# Patient Record
Sex: Female | Born: 1945
Health system: Southern US, Community
[De-identification: ages and names within clinical notes are randomized; demographics above are authoritative.]

## PROBLEM LIST (undated history)

## (undated) DIAGNOSIS — Z972 Presence of dental prosthetic device (complete) (partial): Secondary | ICD-10-CM

## (undated) DIAGNOSIS — E039 Hypothyroidism, unspecified: Secondary | ICD-10-CM

## (undated) DIAGNOSIS — I10 Essential (primary) hypertension: Secondary | ICD-10-CM

## (undated) HISTORY — PX: NASAL SINUS SURGERY: SHX719

## (undated) HISTORY — PX: LASIK: SHX215

## (undated) HISTORY — PX: COLONOSCOPY: SHX174

## (undated) HISTORY — PX: ABDOMINAL HYSTERECTOMY: SHX81

---

## 2004-10-05 ENCOUNTER — Ambulatory Visit: Payer: Self-pay | Admitting: Internal Medicine

## 2006-12-11 ENCOUNTER — Ambulatory Visit: Payer: Self-pay | Admitting: Family Medicine

## 2007-10-01 ENCOUNTER — Ambulatory Visit: Payer: Self-pay

## 2009-02-07 ENCOUNTER — Ambulatory Visit: Payer: Self-pay | Admitting: Family Medicine

## 2012-08-05 ENCOUNTER — Ambulatory Visit: Payer: Self-pay | Admitting: Family Medicine

## 2012-08-11 ENCOUNTER — Ambulatory Visit: Payer: Self-pay | Admitting: Family Medicine

## 2013-04-02 ENCOUNTER — Ambulatory Visit: Payer: Self-pay | Admitting: Family Medicine

## 2013-09-29 ENCOUNTER — Ambulatory Visit: Payer: Self-pay | Admitting: Family Medicine

## 2014-11-04 ENCOUNTER — Other Ambulatory Visit: Payer: Self-pay | Admitting: Family Medicine

## 2014-11-04 DIAGNOSIS — Z1231 Encounter for screening mammogram for malignant neoplasm of breast: Secondary | ICD-10-CM

## 2014-11-17 ENCOUNTER — Ambulatory Visit
Admission: RE | Admit: 2014-11-17 | Discharge: 2014-11-17 | Disposition: A | Discharge status: Home / Self Care | Payer: Medicare Other | Source: Ambulatory Visit | Attending: Family Medicine | Admitting: Family Medicine

## 2014-11-17 ENCOUNTER — Other Ambulatory Visit: Payer: Self-pay | Admitting: Family Medicine

## 2014-11-17 DIAGNOSIS — Z1231 Encounter for screening mammogram for malignant neoplasm of breast: Secondary | ICD-10-CM

## 2016-05-09 ENCOUNTER — Ambulatory Visit (INDEPENDENT_AMBULATORY_CARE_PROVIDER_SITE_OTHER): Payer: Medicare Other

## 2016-05-09 ENCOUNTER — Ambulatory Visit: Payer: Medicare Other

## 2016-05-09 ENCOUNTER — Ambulatory Visit
Admission: EM | Admit: 2016-05-09 | Discharge: 2016-05-09 | Disposition: A | Discharge status: Home / Self Care | Payer: Medicare Other | Attending: Emergency Medicine | Admitting: Emergency Medicine

## 2016-05-09 DIAGNOSIS — S52502A Unspecified fracture of the lower end of left radius, initial encounter for closed fracture: Secondary | ICD-10-CM

## 2016-05-09 HISTORY — DX: Essential (primary) hypertension: I10

## 2016-05-09 HISTORY — DX: Hypothyroidism, unspecified: E03.9

## 2016-05-09 MED ORDER — NAPROXEN 500 MG PO TABS: 500.0000 mg | ORAL_TABLET | Freq: Two times a day (BID) | ORAL | 0 refills | Status: DC

## 2016-05-09 MED ORDER — TRAMADOL HCL 50 MG PO TABS: ORAL_TABLET | ORAL | 0 refills | Status: DC

## 2016-05-09 NOTE — Discharge Instructions (Signed)
Follow-up with the [facility] here in [place] within one week. You may continue your Naprosyn. You can take 1 g of Tylenol up to 4 times a day as needed for pain. This with the Naprosyn is an effective combination for pain. Tramadol for severe pain only.

## 2016-05-09 NOTE — ED Triage Notes (Signed)
Patient complains of a fall one week ago. Patient has significant bruising and deformity of her left wrist. Patient states that pain has been constant. Patient states that she thought she could have waited it out. Patient reports that she fell and hit her arm and she also hit her head and has knot on the left side.

## 2016-05-09 NOTE — ED Provider Notes (Signed)
HPI  SUBJECTIVE:  Brianna Benson is a right handed 70 y.o. female who presents with left wrist pain, swelling, bruising after having a fall from standing on a chair one week ago. States that she lost her balance and fell  onto an outstretched left wrist. She reports intermittent, sharp, seconds long pain with pronation, flexion and extension, radial and ulnar deviation, and thumb flexion. She reports bruising of her volar wrist and dorsal hand, swelling.  Symptoms are better with BC powders and Naprosyn. She has not tried anything else for this. She denies numbness, tingling, limitation motion of her hand or fingers. She denies shoulder, arm, elbow injury. She also states she "bumped her head" but she denies loss of consciousness, headaches or neck pain. She has no previous history of wrist injury. She has a past medical history of hypothyroidism, osteopenia, hypertension, depression. She has no history of anticoagulant/antiplatelet use, kidney disease. PMD: Dr. Tonia GhentAbby Bender.   Past Medical History:  Diagnosis Date  . Hypertension   . Hypothyroidism     Past Surgical History:  Procedure Laterality Date  . NASAL SINUS SURGERY      Family History  Problem Relation Age of Onset  . Breast cancer Maternal Aunt     Social History  Substance Use Topics  . Smoking status: Current Every Day Smoker    Packs/day: 2.00    Types: Cigarettes  . Smokeless tobacco: Never Used  . Alcohol use Yes     Comment: beer daily    No current facility-administered medications for this encounter.   Current Outpatient Prescriptions:  .  levothyroxine (SYNTHROID, LEVOTHROID) 112 MCG tablet, Take 112 mcg by mouth daily before breakfast., Disp: , Rfl:  .  lisinopril (PRINIVIL,ZESTRIL) 10 MG tablet, Take 10 mg by mouth daily., Disp: , Rfl:  .  naproxen (NAPROSYN) 500 MG tablet, Take 1 tablet (500 mg total) by mouth 2 (two) times daily., Disp: 20 tablet, Rfl: 0 .  traMADol (ULTRAM) 50 MG tablet, 1-2 tabs po q 6  hr prn pain Maximum dose= 8 tablets per day, Disp: 20 tablet, Rfl: 0  No Known Allergies   ROS  As noted in HPI.   Physical Exam  BP (!) 141/57 (BP Location: Left Arm)   Pulse 69   Temp 98.3 F (36.8 C) (Tympanic)   Resp 17   Ht 5' 5.5" (1.664 m)   Wt 130 lb (59 kg)   SpO2 96%   BMI 21.30 kg/m   Constitutional: Well developed, well nourished, no acute distress Eyes:  EOMI, conjunctiva normal bilaterally HENT: Normocephalic, atraumatic,mucus membranes moist Respiratory: Normal inspiratory effort Cardiovascular: Normal rate GI: nondistended skin: No rash, skin intact Musculoskeletal: L  distal radius tender, Positive deformity. Bruising on the volar aspects of her wrist. distal ulnar styloid NT, snuffbox NT, carpals NT, metacarpals NT, digits NT , TFCC  tender.  Pain  With passive supination,   no pain with passive pronation, no pain with passive radial / ulnar deviation, passive flexion-extension. Motor intact ability to flex / extend digits of affected hand, Sensation LT to hand normal. CR<2 seconds distally.  Shoulder and upper arm NT, Elbow and proximal forearm NT. Neurologic: Alert & oriented x 3, no focal neuro deficits Psychiatric: Speech and behavior appropriate   ED Course   Medications - No data to display  Orders Placed This Encounter  Procedures  . DG Wrist Complete Left    Standing Status:   Standing    Number of Occurrences:   1  Order Specific Question:   Reason for Exam (SYMPTOM  OR DIAGNOSIS REQUIRED)    Answer:   s/p fall and deformity  . Ambulatory referral to Orthopedic Surgery    Referral Priority:   Urgent    Referral Type:   Surgical    Referral Reason:   Specialty Services Required    Requested Specialty:   Orthopedic Surgery    Number of Visits Requested:   1  . Apply other splint    Standing Status:   Standing    Number of Occurrences:   1    Order Specific Question:   Laterality    Answer:   Left    Order Specific Question:   Splint  type    Answer:   sugartong splint wrist    No results found for this or any previous visit (from the past 24 hour(s)). Dg Wrist Complete Left  Result Date: 05/09/2016 CLINICAL DATA:  Fall 5 days ago on outstretched left hand, pain, initial encounter. EXAM: LEFT WRIST - COMPLETE 3+ VIEW COMPARISON:  None. FINDINGS: There is a transversely oriented and mildly impacted fracture of the distal radial metaphysis. No additional evidence of an acute fracture. IMPRESSION: Transversely oriented and mildly impacted fracture of the distal radial metaphysis. Electronically Signed   By: Leanna BattlesMelinda  Blietz M.D.   On: 05/09/2016 12:05    ED Clinical Impression  Closed fracture of distal end of left radius, unspecified fracture morphology, initial encounter  ED Assessment/Plan   No evidence of significant head injury. She is neurologically intact and his been awake since the incident. Deferring head CT.  Reviewed imaging independently positive transversely oriented and mildly impacted fracture of the distal radial metaphysis. See radiology report for details.  Discussed case with Dr. Joice LoftsPoggi, ortho on call. Advised sugartong splint, Tylenol, tramadol. She may continue her Naprosyn. She is to follow-up in their clinic within the week at [facility] [place]. Discussed imaging, MDM, plan and followup with patient.  Discussed sn/sx that should prompt return to the ED. Patient agrees with plan.   Meds ordered this encounter  Medications  . levothyroxine (SYNTHROID, LEVOTHROID) 112 MCG tablet    Sig: Take 112 mcg by mouth daily before breakfast.  . lisinopril (PRINIVIL,ZESTRIL) 10 MG tablet    Sig: Take 10 mg by mouth daily.  . traMADol (ULTRAM) 50 MG tablet    Sig: 1-2 tabs po q 6 hr prn pain Maximum dose= 8 tablets per day    Dispense:  20 tablet    Refill:  0  . naproxen (NAPROSYN) 500 MG tablet    Sig: Take 1 tablet (500 mg total) by mouth 2 (two) times daily.    Dispense:  20 tablet    Refill:  0     *This clinic note was created using Scientist, clinical (histocompatibility and immunogenetics)Dragon dictation software. Therefore, there may be occasional mistakes despite careful proofreading.  ?   Domenick GongAshley Honestie Kulik, MD 05/09/16 1242

## 2016-08-28 ENCOUNTER — Encounter: Payer: Self-pay | Admitting: *Deleted

## 2016-08-31 NOTE — Discharge Instructions (Signed)
Cataract Surgery, Care After °Refer to this sheet in the next few weeks. These instructions provide you with information about caring for yourself after your procedure. Your health care provider may also give you more specific instructions. Your treatment has been planned according to current medical practices, but problems sometimes occur. Call your health care provider if you have any problems or questions after your procedure. °What can I expect after the procedure? °After the procedure, it is common to have: °· Itching. °· Discomfort. °· Fluid discharge. °· Sensitivity to light and to touch. °· Bruising. °Follow these instructions at home: °Eye Care  °· Check your eye every day for signs of infection. Watch for: °¨ Redness, swelling, or pain. °¨ Fluid, blood, or pus. °¨ Warmth. °¨ Bad smell. °Activity  °· Avoid strenuous activities, such as playing contact sports, for as long as told by your health care provider. °· Do not drive or operate heavy machinery until your health care provider approves. °· Do not bend or lift heavy objects . Bending increases pressure in the eye. You can walk, climb stairs, and do light household chores. °· Ask your health care provider when you can return to work. If you work in a dusty environment, you may be advised to wear protective eyewear for a period of time. °General instructions  °· Take or apply over-the-counter and prescription medicines only as told by your health care provider. This includes eye drops. °· Do not touch or rub your eyes. °· If you were given a protective shield, wear it as told by your health care provider. If you were not given a protective shield, wear sunglasses as told by your health care provider to protect your eyes. °· Keep the area around your eye clean and dry. Avoid swimming or allowing water to hit you directly in the face while showering until told by your health care provider. Keep soap and shampoo out of your eyes. °· Do not put a contact lens  into the affected eye or eyes until your health care provider approves. °· Keep all follow-up visits as told by your health care provider. This is important. °Contact a health care provider if: ° °· You have increased bruising around your eye. °· You have pain that is not helped with medicine. °· You have a fever. °· You have redness, swelling, or pain in your eye. °· You have fluid, blood, or pus coming from your incision. °· Your vision gets worse. °Get help right away if: °· You have sudden vision loss. °This information is not intended to replace advice given to you by your health care provider. Make sure you discuss any questions you have with your health care provider. °Document Released: 12/29/2004 Document Revised: 10/20/2015 Document Reviewed: 04/21/2015 °Elsevier Interactive Patient Education © 2017 [employer]vier [employer] ° ° ° ° °General Anesthesia, Adult, Care After °These instructions provide you with information about caring for yourself after your procedure. Your health care provider may also give you more specific instructions. Your treatment has been planned according to current medical practices, but problems sometimes occur. Call your health care provider if you have any problems or questions after your procedure. °What can I expect after the procedure? °After the procedure, it is common to have: °· Vomiting. °· A sore throat. °· Mental slowness. °It is common to feel: °· Nauseous. °· Cold or shivery. °· Sleepy. °· Tired. °· Sore or achy, even in parts of your body where you did not have surgery. °Follow these instructions at   home: °For at least 24 hours after the procedure:  °· Do not: °¨ Participate in activities where you could fall or become injured. °¨ Drive. °¨ Use heavy machinery. °¨ Drink alcohol. °¨ Take sleeping pills or medicines that cause drowsiness. °¨ Make important decisions or sign legal documents. °¨ Take care of children on your own. °· Rest. °Eating and drinking  °· If you vomit, drink  water, juice, or soup when you can drink without vomiting. °· Drink enough fluid to keep your urine clear or pale yellow. °· Make sure you have little or no nausea before eating solid foods. °· Follow the diet recommended by your health care provider. °General instructions  °· Have a responsible adult stay with you until you are awake and alert. °· Return to your normal activities as told by your health care provider. Ask your health care provider what activities are safe for you. °· Take over-the-counter and prescription medicines only as told by your health care provider. °· If you smoke, do not smoke without supervision. °· Keep all follow-up visits as told by your health care provider. This is important. °Contact a health care provider if: °· You continue to have nausea or vomiting at home, and medicines are not helpful. °· You cannot drink fluids or start eating again. °· You cannot urinate after 8-12 hours. °· You develop a skin rash. °· You have fever. °· You have increasing redness at the site of your procedure. °Get help right away if: °· You have difficulty breathing. °· You have chest pain. °· You have unexpected bleeding. °· You feel that you are having a life-threatening or urgent problem. °This information is not intended to replace advice given to you by your health care provider. Make sure you discuss any questions you have with your health care provider. °Document Released: 09/17/2000 Document Revised: 11/14/2015 Document Reviewed: 05/26/2015 °Elsevier Interactive Patient Education © 2017 Elsevier Inc. ° °

## 2016-09-03 ENCOUNTER — Encounter
Admission: RE | Disposition: A | Discharge status: Home / Self Care | Payer: Self-pay | Source: Ambulatory Visit | Attending: Ophthalmology

## 2016-09-03 ENCOUNTER — Ambulatory Visit: Payer: Medicare Other | Admitting: Anesthesiology

## 2016-09-03 ENCOUNTER — Ambulatory Visit
Admission: RE | Admit: 2016-09-03 | Discharge: 2016-09-03 | Disposition: A | Discharge status: Home / Self Care | Payer: Medicare Other | Source: Ambulatory Visit | Attending: Ophthalmology | Admitting: Ophthalmology

## 2016-09-03 DIAGNOSIS — F17200 Nicotine dependence, unspecified, uncomplicated: Secondary | ICD-10-CM | POA: Insufficient documentation

## 2016-09-03 DIAGNOSIS — I10 Essential (primary) hypertension: Secondary | ICD-10-CM | POA: Diagnosis not present

## 2016-09-03 DIAGNOSIS — E039 Hypothyroidism, unspecified: Secondary | ICD-10-CM | POA: Insufficient documentation

## 2016-09-03 DIAGNOSIS — H2512 Age-related nuclear cataract, left eye: Secondary | ICD-10-CM | POA: Insufficient documentation

## 2016-09-03 HISTORY — DX: Presence of dental prosthetic device (complete) (partial): Z97.2

## 2016-09-03 HISTORY — PX: CATARACT EXTRACTION W/PHACO: SHX586

## 2016-09-03 SURGERY — PHACOEMULSIFICATION, CATARACT, WITH IOL INSERTION
Anesthesia: Monitor Anesthesia Care | Site: Eye | Laterality: Left | Wound class: Clean

## 2016-09-03 MED ORDER — EPINEPHRINE PF 1 MG/ML IJ SOLN
INTRAMUSCULAR | Status: DC | PRN
  Administered 2016-09-03: 121 mL via OPHTHALMIC

## 2016-09-03 MED ORDER — FENTANYL CITRATE (PF) 100 MCG/2ML IJ SOLN
INTRAMUSCULAR | Status: DC | PRN
  Administered 2016-09-03: 50 ug via INTRAVENOUS

## 2016-09-03 MED ORDER — MOXIFLOXACIN HCL 0.5 % OP SOLN
1.0000 [drp] | OPHTHALMIC | Status: DC | PRN
  Administered 2016-09-03 (×3): 1 [drp] via OPHTHALMIC

## 2016-09-03 MED ORDER — ACETAMINOPHEN 160 MG/5ML PO SOLN: 325.0000 mg | ORAL | Status: DC | PRN

## 2016-09-03 MED ORDER — NA HYALUR & NA CHOND-NA HYALUR 0.4-0.35 ML IO KIT
PACK | INTRAOCULAR | Status: DC | PRN
  Administered 2016-09-03: 1 mL via INTRAOCULAR

## 2016-09-03 MED ORDER — BRIMONIDINE TARTRATE-TIMOLOL 0.2-0.5 % OP SOLN
OPHTHALMIC | Status: DC | PRN
  Administered 2016-09-03: 1 [drp] via OPHTHALMIC

## 2016-09-03 MED ORDER — CEFUROXIME OPHTHALMIC INJECTION 1 MG/0.1 ML
INJECTION | OPHTHALMIC | Status: DC | PRN
Start: 2016-09-03 — End: 2016-09-03
  Administered 2016-09-03: .2 mL via SUBCONJUNCTIVAL

## 2016-09-03 MED ORDER — ACETAMINOPHEN 325 MG PO TABS: 325.0000 mg | ORAL_TABLET | ORAL | Status: DC | PRN

## 2016-09-03 MED ORDER — MIDAZOLAM HCL 2 MG/2ML IJ SOLN
INTRAMUSCULAR | Status: DC | PRN
  Administered 2016-09-03: 2 mg via INTRAVENOUS

## 2016-09-03 MED ORDER — ARMC OPHTHALMIC DILATING DROPS
1.0000 "application " | OPHTHALMIC | Status: DC | PRN
  Administered 2016-09-03 (×3): 1 via OPHTHALMIC

## 2016-09-03 MED ORDER — LIDOCAINE HCL (PF) 2 % IJ SOLN
INTRAMUSCULAR | Status: DC | PRN
  Administered 2016-09-03: 1 mL

## 2016-09-03 SURGICAL SUPPLY — 23 items
APPLICATOR COTTON TIP 3IN (MISCELLANEOUS) ×3 IMPLANT
CANNULA ANT/CHMB 27GA (MISCELLANEOUS) ×3 IMPLANT
DISSECTOR HYDRO NUCLEUS 50X22 (MISCELLANEOUS) ×3 IMPLANT
GLOVE BIO SURGEON STRL SZ7 (GLOVE) ×3 IMPLANT
GLOVE SURG LX 6.5 MICRO (GLOVE) ×2
GLOVE SURG LX STRL 6.5 MICRO (GLOVE) ×1 IMPLANT
GOWN STRL REUS W/ TWL LRG LVL3 (GOWN DISPOSABLE) ×2 IMPLANT
GOWN STRL REUS W/TWL LRG LVL3 (GOWN DISPOSABLE) ×4
LENS IOL ACRSF IQ ULTRA 26.5 (Intraocular Lens) ×1 IMPLANT
LENS IOL ACRYSOF IQ 26.5 (Intraocular Lens) ×3 IMPLANT
MARKER SKIN DUAL TIP RULER LAB (MISCELLANEOUS) ×3 IMPLANT
PACK CATARACT BRASINGTON (MISCELLANEOUS) ×3 IMPLANT
PACK EYE AFTER SURG (MISCELLANEOUS) ×3 IMPLANT
PACK OPTHALMIC (MISCELLANEOUS) ×3 IMPLANT
RING MALYGIN 7.0 (MISCELLANEOUS) IMPLANT
SUT ETHILON 10-0 CS-B-6CS-B-6 (SUTURE)
SUT VICRYL  9 0 (SUTURE)
SUT VICRYL 9 0 (SUTURE) IMPLANT
SUTURE EHLN 10-0 CS-B-6CS-B-6 (SUTURE) IMPLANT
SYR TB 1ML LUER SLIP (SYRINGE) ×3 IMPLANT
WATER STERILE IRR 250ML POUR (IV SOLUTION) ×3 IMPLANT
WICK EYE OCUCEL (MISCELLANEOUS) IMPLANT
WIPE NON LINTING 3.25X3.25 (MISCELLANEOUS) ×3 IMPLANT

## 2016-09-03 NOTE — Anesthesia Procedure Notes (Signed)
Performed by: Karlo Goeden Pre-anesthesia Checklist: Patient identified, Emergency Drugs available, Suction available, Timeout performed and Patient being monitored Patient Re-evaluated:Patient Re-evaluated prior to inductionOxygen Delivery Method: Circle system utilized Preoxygenation: Pre-oxygenation with 100% oxygen Intubation Type: Inhalational induction Ventilation: Mask ventilation without difficulty and Mask ventilation throughout procedure Dental Injury: Teeth and Oropharynx as per pre-operative assessment        

## 2016-09-03 NOTE — H&P (Signed)
H+P reviewed and is up to date, please see paper chart.  

## 2016-09-03 NOTE — Op Note (Signed)
Date of Surgery: 09/03/2016  PREOPERATIVE DIAGNOSES: Visually significant nuclear sclerotic cataract, left eye.  POSTOPERATIVE DIAGNOSES: Same  PROCEDURES PERFORMED: Cataract extraction with intraocular lens implant, left eye.  SURGEON: Almon Hercules, M.D.  ANESTHESIA: MAC and topical  IMPLANTS: [id number] +26.5 D  Implant Name Type Inv. Item Serial No. Manufacturer Lot No. LRB No. Used  LENS IOL ACRYSOF IQ 26.5 - [id number] Intraocular Lens LENS IOL ACRYSOF IQ 26.5 [id number] ALCON   Left 1    COMPLICATIONS: None.  DESCRIPTION OF PROCEDURE: Therapeutic options were discussed with the patient preoperatively, including a discussion of risks and benefits of surgery. Informed consent was obtained. An IOL-Master and immersion biometry were used to take the lens measurements, and a dilated fundus exam was performed within 6 months of the surgical date.  The patient was premedicated and brought to the operating room and placed on the operating table in the supine position. After adequate anesthesia, the patient was prepped and draped in the usual sterile ophthalmic fashion. A wire lid speculum was inserted and the microscope was positioned. A Superblade was used to create a paracentesis site at the limbus and a small amount of dilute preservative free lidocaine was instilled into the anterior chamber, followed by dispersive viscoelastic. A clear corneal incision was created temporally using a 2.4 mm keratome blade. Capsulorrhexis was then performed. In situ phacoemulsification was performed.  Cortical material was removed with the irrigation-aspiration unit. Dispersive viscoelastic was instilled to open the capsular bag. A posterior chamber intraocular lens with the specifications above was inserted and positioned. Irrigation-aspiration was used to remove all viscoelastic. Cefuroxime 1cc was instilled into the anterior chamber, and the corneal incision was checked and found to be water tight. The  eyelid speculum was removed.  The operative eye was covered with protective goggles after instilling 1 drop of timolol and brimonidine. The patient tolerated the procedure well. There were no complications.

## 2016-09-03 NOTE — Anesthesia Preprocedure Evaluation (Signed)
Anesthesia Evaluation  Patient identified by MRN, date of birth, ID band Patient awake    Reviewed: Allergy & Precautions, H&P , NPO status , Patient's Chart, lab work & pertinent test results, reviewed documented beta blocker date and time   Airway Mallampati: II  TM Distance: >3 FB Neck ROM: full    Dental no notable dental hx.    Pulmonary Current Smoker,    Pulmonary exam normal breath sounds clear to auscultation       Cardiovascular Exercise Tolerance: Good hypertension,  Rhythm:regular Rate:Normal     Neuro/Psych negative neurological ROS  negative psych ROS   GI/Hepatic negative GI ROS, Neg liver ROS,   Endo/Other  Hypothyroidism   Renal/GU negative Renal ROS  negative genitourinary   Musculoskeletal   Abdominal   Peds  Hematology negative hematology ROS (+)   Anesthesia Other Findings   Reproductive/Obstetrics negative OB ROS                             Anesthesia Physical Anesthesia Plan  ASA: II  Anesthesia Plan: MAC   Post-op Pain Management:    Induction:   Airway Management Planned:   Additional Equipment:   Intra-op Plan:   Post-operative Plan:   Informed Consent: I have reviewed the patients History and Physical, chart, labs and discussed the procedure including the risks, benefits and alternatives for the proposed anesthesia with the patient or authorized representative who has indicated his/her understanding and acceptance.   Dental Advisory Given  Plan Discussed with: CRNA  Anesthesia Plan Comments:         Anesthesia Quick Evaluation

## 2016-09-03 NOTE — Transfer of Care (Signed)
Immediate Anesthesia Transfer of Care Note  Patient: Brianna Benson  Procedure(s) Performed: Procedure(s): CATARACT EXTRACTION PHACO AND INTRAOCULAR LENS PLACEMENT (IOC)  Left (Left)  Patient Location: PACU  Anesthesia Type: MAC  Level of Consciousness: awake, alert  and patient cooperative  Airway and Oxygen Therapy: Patient Spontanous Breathing and Patient connected to supplemental oxygen  Post-op Assessment: Post-op Vital signs reviewed, Patient's Cardiovascular Status Stable, Respiratory Function Stable, Patent Airway and No signs of Nausea or vomiting  Post-op Vital Signs: Reviewed and stable  Complications: No apparent anesthesia complications

## 2016-09-03 NOTE — Anesthesia Postprocedure Evaluation (Signed)
Anesthesia Post Note  Patient: Brianna Benson  Procedure(s) Performed: Procedure(s) (LRB): CATARACT EXTRACTION PHACO AND INTRAOCULAR LENS PLACEMENT (IOC)  Left (Left)  Patient location during evaluation: PACU Anesthesia Type: MAC Level of consciousness: awake and alert Pain management: pain level controlled Vital Signs Assessment: post-procedure vital signs reviewed and stable Respiratory status: spontaneous breathing, nonlabored ventilation, respiratory function stable and patient connected to nasal cannula oxygen Cardiovascular status: stable and blood pressure returned to baseline Anesthetic complications: no    Alisa Graff

## 2016-09-04 ENCOUNTER — Encounter: Payer: Self-pay | Admitting: Ophthalmology

## 2017-09-09 DIAGNOSIS — I10 Essential (primary) hypertension: Secondary | ICD-10-CM | POA: Diagnosis not present

## 2017-09-09 DIAGNOSIS — E559 Vitamin D deficiency, unspecified: Secondary | ICD-10-CM | POA: Diagnosis not present

## 2017-09-09 DIAGNOSIS — E039 Hypothyroidism, unspecified: Secondary | ICD-10-CM | POA: Diagnosis not present

## 2018-04-14 ENCOUNTER — Ambulatory Visit
Admission: RE | Admit: 2018-04-14 | Discharge: 2018-04-14 | Disposition: A | Discharge status: Home / Self Care | Payer: Medicare HMO | Source: Ambulatory Visit | Attending: Family Medicine | Admitting: Family Medicine

## 2018-04-14 ENCOUNTER — Other Ambulatory Visit: Payer: Self-pay | Admitting: Family Medicine

## 2018-04-14 DIAGNOSIS — S2241XA Multiple fractures of ribs, right side, initial encounter for closed fracture: Secondary | ICD-10-CM | POA: Insufficient documentation

## 2018-04-14 DIAGNOSIS — R0781 Pleurodynia: Secondary | ICD-10-CM | POA: Diagnosis not present

## 2018-04-14 DIAGNOSIS — E785 Hyperlipidemia, unspecified: Secondary | ICD-10-CM | POA: Diagnosis not present

## 2018-04-14 DIAGNOSIS — E039 Hypothyroidism, unspecified: Secondary | ICD-10-CM | POA: Diagnosis not present

## 2018-04-14 DIAGNOSIS — Z1159 Encounter for screening for other viral diseases: Secondary | ICD-10-CM | POA: Diagnosis not present

## 2018-04-14 DIAGNOSIS — E559 Vitamin D deficiency, unspecified: Secondary | ICD-10-CM | POA: Diagnosis not present

## 2018-04-25 DIAGNOSIS — H2511 Age-related nuclear cataract, right eye: Secondary | ICD-10-CM | POA: Diagnosis not present

## 2018-05-08 DIAGNOSIS — E876 Hypokalemia: Secondary | ICD-10-CM | POA: Diagnosis not present

## 2018-05-08 DIAGNOSIS — R0781 Pleurodynia: Secondary | ICD-10-CM | POA: Diagnosis not present

## 2018-05-13 ENCOUNTER — Telehealth: Payer: Self-pay | Admitting: *Deleted

## 2018-05-13 DIAGNOSIS — Z122 Encounter for screening for malignant neoplasm of respiratory organs: Secondary | ICD-10-CM

## 2018-05-13 DIAGNOSIS — Z87891 Personal history of nicotine dependence: Secondary | ICD-10-CM

## 2018-05-13 NOTE — Telephone Encounter (Signed)
Received referral for initial lung cancer screening scan. Contacted patient and obtained smoking history,(current, 52 pack year) as well as answering questions related to screening process. Patient denies signs of lung cancer such as weight loss or hemoptysis. Patient denies comorbidity that would prevent curative treatment if lung cancer were found. Patient is scheduled for shared decision making visit and CT scan on 06/03/18 at 130.

## 2018-05-14 ENCOUNTER — Other Ambulatory Visit: Payer: Self-pay | Admitting: Family Medicine

## 2018-05-28 DIAGNOSIS — Z1389 Encounter for screening for other disorder: Secondary | ICD-10-CM | POA: Diagnosis not present

## 2018-05-28 DIAGNOSIS — Z0000 Encounter for general adult medical examination without abnormal findings: Secondary | ICD-10-CM | POA: Diagnosis not present

## 2018-05-28 DIAGNOSIS — E785 Hyperlipidemia, unspecified: Secondary | ICD-10-CM | POA: Diagnosis not present

## 2018-05-28 DIAGNOSIS — Z23 Encounter for immunization: Secondary | ICD-10-CM | POA: Diagnosis not present

## 2018-05-28 DIAGNOSIS — I10 Essential (primary) hypertension: Secondary | ICD-10-CM | POA: Diagnosis not present

## 2018-05-29 ENCOUNTER — Other Ambulatory Visit: Payer: Self-pay | Admitting: Family Medicine

## 2018-05-29 DIAGNOSIS — M8580 Other specified disorders of bone density and structure, unspecified site: Secondary | ICD-10-CM

## 2018-05-29 DIAGNOSIS — Z1231 Encounter for screening mammogram for malignant neoplasm of breast: Secondary | ICD-10-CM

## 2018-06-03 ENCOUNTER — Ambulatory Visit: Payer: Medicare HMO

## 2018-06-03 ENCOUNTER — Ambulatory Visit: Payer: Medicare HMO | Admitting: Oncology

## 2018-06-03 ENCOUNTER — Inpatient Hospital Stay: Payer: Medicare HMO | Admitting: Oncology

## 2018-06-03 ENCOUNTER — Encounter: Payer: Self-pay | Admitting: Oncology

## 2018-06-03 ENCOUNTER — Ambulatory Visit: Payer: Medicare HMO | Attending: Oncology

## 2018-06-24 ENCOUNTER — Encounter: Payer: Self-pay | Admitting: *Deleted

## 2018-06-30 ENCOUNTER — Telehealth: Payer: Self-pay | Admitting: *Deleted

## 2018-06-30 NOTE — Telephone Encounter (Signed)
Received a referral for initial lung cancer screening scan.  Contacted the patient and obtained their smoking history, current smoker 1 ppd for 52 years with 852 pkyr history  as well as answering questions related to screening process.  Patient denies signs of lung cancer such as weight loss or hemoptysis at this time.  Patient denies comorbidity that would prevent curative treatment if lung cancer were found.  Patient is scheduled for the Shared Decision Making Visit and CT scan on 07-19-2018.

## 2018-07-03 DIAGNOSIS — E559 Vitamin D deficiency, unspecified: Secondary | ICD-10-CM | POA: Diagnosis not present

## 2018-07-03 DIAGNOSIS — E039 Hypothyroidism, unspecified: Secondary | ICD-10-CM | POA: Diagnosis not present

## 2018-07-03 DIAGNOSIS — F17209 Nicotine dependence, unspecified, with unspecified nicotine-induced disorders: Secondary | ICD-10-CM | POA: Diagnosis not present

## 2018-07-03 DIAGNOSIS — I10 Essential (primary) hypertension: Secondary | ICD-10-CM | POA: Diagnosis not present

## 2018-07-16 DIAGNOSIS — H2511 Age-related nuclear cataract, right eye: Secondary | ICD-10-CM | POA: Diagnosis not present

## 2018-07-17 ENCOUNTER — Other Ambulatory Visit: Payer: Medicare HMO

## 2018-07-21 ENCOUNTER — Telehealth: Payer: Self-pay | Admitting: *Deleted

## 2018-07-21 NOTE — Telephone Encounter (Signed)
Called pt to remind her of her appt for ldct screening on 07-22-2018 @0830 , voiced understanding.

## 2018-07-22 ENCOUNTER — Inpatient Hospital Stay: Payer: Medicare HMO | Attending: Oncology | Admitting: Nurse Practitioner

## 2018-07-22 ENCOUNTER — Ambulatory Visit
Admission: RE | Admit: 2018-07-22 | Discharge: 2018-07-22 | Disposition: A | Discharge status: Home / Self Care | Payer: Medicare HMO | Source: Ambulatory Visit | Attending: Oncology | Admitting: Oncology

## 2018-07-22 DIAGNOSIS — Z87891 Personal history of nicotine dependence: Secondary | ICD-10-CM | POA: Insufficient documentation

## 2018-07-22 DIAGNOSIS — F17210 Nicotine dependence, cigarettes, uncomplicated: Secondary | ICD-10-CM

## 2018-07-22 DIAGNOSIS — Z720 Tobacco use: Secondary | ICD-10-CM

## 2018-07-22 DIAGNOSIS — Z122 Encounter for screening for malignant neoplasm of respiratory organs: Secondary | ICD-10-CM

## 2018-07-22 NOTE — Progress Notes (Signed)
In accordance with CMS guidelines, patient has met eligibility criteria including age, absence of signs or symptoms of lung cancer.  Social History   Tobacco Use  . Smoking status: Current Every Day Smoker    Packs/day: 1.00    Years: 52.00    Pack years: 52.00    Types: Cigarettes  . Smokeless tobacco: Never Used  Substance Use Topics  . Alcohol use: Yes    Comment: beer daily  . Drug use: No      A shared decision-making session was conducted prior to the performance of CT scan. This includes one or more decision aids, includes benefits and harms of screening, follow-up diagnostic testing, over-diagnosis, false positive rate, and total radiation exposure.   Counseling on the importance of adherence to annual lung cancer LDCT screening, impact of co-morbidities, and ability or willingness to undergo diagnosis and treatment is imperative for compliance of the program.   Counseling on the importance of continued smoking cessation for former smokers; the importance of smoking cessation for current smokers, and information about tobacco cessation interventions have been given to patient including [place] and 1800 quit [place] programs.   Written order for lung cancer screening with LDCT has been given to the patient and any and all questions have been answered to the best of my abilities.    Yearly follow up will be coordinated by Burgess Estelle, Thoracic Navigator.  Beckey Rutter, DNP, AGNP-C [place] [facility] [phone] (work cell) [phone] (office) 07/22/18 10:06 AM

## 2018-07-23 ENCOUNTER — Encounter: Payer: Self-pay | Admitting: *Deleted

## 2018-07-29 ENCOUNTER — Other Ambulatory Visit: Payer: Self-pay

## 2018-07-29 ENCOUNTER — Encounter: Payer: Self-pay | Admitting: *Deleted

## 2018-07-30 ENCOUNTER — Ambulatory Visit
Admission: RE | Admit: 2018-07-30 | Discharge: 2018-07-30 | Disposition: A | Discharge status: Home / Self Care | Payer: Medicare HMO | Source: Ambulatory Visit | Attending: Family Medicine | Admitting: Family Medicine

## 2018-07-30 DIAGNOSIS — Z1231 Encounter for screening mammogram for malignant neoplasm of breast: Secondary | ICD-10-CM

## 2018-07-30 DIAGNOSIS — M8580 Other specified disorders of bone density and structure, unspecified site: Secondary | ICD-10-CM

## 2018-07-30 DIAGNOSIS — M810 Age-related osteoporosis without current pathological fracture: Secondary | ICD-10-CM | POA: Insufficient documentation

## 2018-07-30 DIAGNOSIS — Z1239 Encounter for other screening for malignant neoplasm of breast: Secondary | ICD-10-CM | POA: Diagnosis not present

## 2018-07-30 NOTE — Discharge Instructions (Signed)

## 2018-08-01 NOTE — Anesthesia Preprocedure Evaluation (Addendum)
Anesthesia Evaluation  Patient identified by MRN, date of birth, ID band Patient awake    Reviewed: Allergy & Precautions, NPO status , Patient's Chart, lab work & pertinent test results  History of Anesthesia Complications Negative for: history of anesthetic complications  Airway Mallampati: II   Neck ROM: Full    Dental  (+) Upper Dentures,    Pulmonary Current Smoker (1 ppd),    Pulmonary exam normal breath sounds clear to auscultation       Cardiovascular hypertension, Normal cardiovascular exam Rhythm:Regular Rate:Normal     Neuro/Psych negative neurological ROS     GI/Hepatic negative GI ROS,   Endo/Other  Hypothyroidism   Renal/GU negative Renal ROS     Musculoskeletal   Abdominal   Peds  Hematology negative hematology ROS (+)   Anesthesia Other Findings   Reproductive/Obstetrics                            Anesthesia Physical Anesthesia Plan  ASA: II  Anesthesia Plan: MAC   Post-op Pain Management:    Induction: Intravenous  PONV Risk Score and Plan: 1 and TIVA and Midazolam  Airway Management Planned: Natural Airway  Additional Equipment:   Intra-op Plan:   Post-operative Plan:   Informed Consent: I have reviewed the patients History and Physical, chart, labs and discussed the procedure including the risks, benefits and alternatives for the proposed anesthesia with the patient or authorized representative who has indicated his/her understanding and acceptance.       Plan Discussed with: CRNA  Anesthesia Plan Comments:        Anesthesia Quick Evaluation

## 2018-08-04 ENCOUNTER — Encounter
Admission: RE | Disposition: A | Discharge status: Home / Self Care | Payer: Self-pay | Source: Home / Self Care | Attending: Ophthalmology

## 2018-08-04 ENCOUNTER — Ambulatory Visit
Admission: RE | Admit: 2018-08-04 | Discharge: 2018-08-04 | Disposition: A | Discharge status: Home / Self Care | Payer: Medicare HMO | Attending: Ophthalmology | Admitting: Ophthalmology

## 2018-08-04 ENCOUNTER — Ambulatory Visit: Payer: Medicare HMO | Admitting: Anesthesiology

## 2018-08-04 DIAGNOSIS — I10 Essential (primary) hypertension: Secondary | ICD-10-CM | POA: Diagnosis not present

## 2018-08-04 DIAGNOSIS — F17200 Nicotine dependence, unspecified, uncomplicated: Secondary | ICD-10-CM | POA: Insufficient documentation

## 2018-08-04 DIAGNOSIS — E039 Hypothyroidism, unspecified: Secondary | ICD-10-CM | POA: Diagnosis not present

## 2018-08-04 DIAGNOSIS — H25811 Combined forms of age-related cataract, right eye: Secondary | ICD-10-CM | POA: Diagnosis not present

## 2018-08-04 DIAGNOSIS — Z79890 Hormone replacement therapy (postmenopausal): Secondary | ICD-10-CM | POA: Insufficient documentation

## 2018-08-04 DIAGNOSIS — Z79899 Other long term (current) drug therapy: Secondary | ICD-10-CM | POA: Insufficient documentation

## 2018-08-04 DIAGNOSIS — H2511 Age-related nuclear cataract, right eye: Secondary | ICD-10-CM | POA: Insufficient documentation

## 2018-08-04 DIAGNOSIS — Z7983 Long term (current) use of bisphosphonates: Secondary | ICD-10-CM | POA: Diagnosis not present

## 2018-08-04 HISTORY — PX: CATARACT EXTRACTION W/PHACO: SHX586

## 2018-08-04 SURGERY — PHACOEMULSIFICATION, CATARACT, WITH IOL INSERTION
Anesthesia: Monitor Anesthesia Care | Site: Eye | Laterality: Right

## 2018-08-04 MED ORDER — SODIUM HYALURONATE 23 MG/ML IO SOLN
INTRAOCULAR | Status: DC | PRN
  Administered 2018-08-04: 0.6 mL via INTRAOCULAR

## 2018-08-04 MED ORDER — MOXIFLOXACIN HCL 0.5 % OP SOLN
OPHTHALMIC | Status: DC | PRN
  Administered 2018-08-04: 0.2 mL via OPHTHALMIC

## 2018-08-04 MED ORDER — ARMC OPHTHALMIC DILATING DROPS
1.0000 "application " | OPHTHALMIC | Status: DC | PRN
  Administered 2018-08-04 (×3): 1 via OPHTHALMIC

## 2018-08-04 MED ORDER — LACTATED RINGERS IV SOLN: 10.0000 mL/h | INTRAVENOUS | Status: DC

## 2018-08-04 MED ORDER — ONDANSETRON HCL 4 MG/2ML IJ SOLN: 4.0000 mg | Freq: Once | INTRAMUSCULAR | Status: DC | PRN

## 2018-08-04 MED ORDER — MIDAZOLAM HCL 2 MG/2ML IJ SOLN
INTRAMUSCULAR | Status: DC | PRN
  Administered 2018-08-04: 2 mg via INTRAVENOUS

## 2018-08-04 MED ORDER — TETRACAINE HCL 0.5 % OP SOLN
1.0000 [drp] | OPHTHALMIC | Status: DC | PRN
Start: 2018-08-04 — End: 2018-08-04
  Administered 2018-08-04 (×3): 1 [drp] via OPHTHALMIC

## 2018-08-04 MED ORDER — FENTANYL CITRATE (PF) 100 MCG/2ML IJ SOLN
INTRAMUSCULAR | Status: DC | PRN
  Administered 2018-08-04: 50 ug via INTRAVENOUS

## 2018-08-04 MED ORDER — EPINEPHRINE PF 1 MG/ML IJ SOLN
INTRAOCULAR | Status: DC | PRN
  Administered 2018-08-04: 84 mL via OPHTHALMIC

## 2018-08-04 MED ORDER — SODIUM HYALURONATE 10 MG/ML IO SOLN
INTRAOCULAR | Status: DC | PRN
  Administered 2018-08-04: 0.55 mL via INTRAOCULAR

## 2018-08-04 MED ORDER — LIDOCAINE HCL (PF) 2 % IJ SOLN
INTRAOCULAR | Status: DC | PRN
  Administered 2018-08-04: 2 mL via INTRAOCULAR

## 2018-08-04 SURGICAL SUPPLY — 19 items
CANNULA ANT/CHMB 27G (MISCELLANEOUS) ×2 IMPLANT
CANNULA ANT/CHMB 27GA (MISCELLANEOUS) ×6 IMPLANT
DISSECTOR HYDRO NUCLEUS 50X22 (MISCELLANEOUS) ×3 IMPLANT
GLOVE SURG LX 7.5 STRW (GLOVE) ×2
GLOVE SURG LX STRL 7.5 STRW (GLOVE) ×1 IMPLANT
GLOVE SURG SYN 8.5  E (GLOVE) ×2
GLOVE SURG SYN 8.5 E (GLOVE) ×1 IMPLANT
GLOVE SURG SYN 8.5 PF PI (GLOVE) ×1 IMPLANT
GOWN STRL REUS W/ TWL LRG LVL3 (GOWN DISPOSABLE) ×2 IMPLANT
GOWN STRL REUS W/TWL LRG LVL3 (GOWN DISPOSABLE) ×4
LENS IOL ACRYSOF IQ 21.0 (Intraocular Lens) ×2 IMPLANT
MARKER SKIN DUAL TIP RULER LAB (MISCELLANEOUS) ×3 IMPLANT
PACK DR. KING ARMS (PACKS) ×3 IMPLANT
PACK EYE AFTER SURG (MISCELLANEOUS) ×3 IMPLANT
PACK OPTHALMIC (MISCELLANEOUS) ×3 IMPLANT
SYR 3ML LL SCALE MARK (SYRINGE) ×3 IMPLANT
SYR TB 1ML LUER SLIP (SYRINGE) ×3 IMPLANT
WATER STERILE IRR 500ML POUR (IV SOLUTION) ×3 IMPLANT
WIPE NON LINTING 3.25X3.25 (MISCELLANEOUS) ×3 IMPLANT

## 2018-08-04 NOTE — Anesthesia Procedure Notes (Signed)
Procedure Name: MAC Date/Time: 08/04/2018 11:01 AM Performed by: Janna Arch, CRNA Pre-anesthesia Checklist: Patient identified, Emergency Drugs available, Suction available, Timeout performed and Patient being monitored Patient Re-evaluated:Patient Re-evaluated prior to induction Oxygen Delivery Method: Nasal cannula Placement Confirmation: positive ETCO2

## 2018-08-04 NOTE — H&P (Signed)

## 2018-08-04 NOTE — Op Note (Signed)
OPERATIVE NOTE  Brianna Benson [id number] 08/04/2018   PREOPERATIVE DIAGNOSIS:  Nuclear sclerotic cataract right eye.  H25.11   POSTOPERATIVE DIAGNOSIS:    Nuclear sclerotic cataract right eye.     PROCEDURE:  Phacoemusification with posterior chamber intraocular lens placement of the right eye   LENS:   Implant Name Type Inv. Item Serial No. Manufacturer Lot No. LRB No. Used  LENS IOL ACRYSOF IQ 21.0 - [id number] Intraocular Lens LENS IOL ACRYSOF IQ 21.0 [id number] ALCON  Right 1       AU00T0 +21.0   ULTRASOUND TIME: 1 minutes 04 seconds.  CDE 8.16   SURGEON:  Willey BladeBradley King, MD, MPH  ANESTHESIOLOGIST: Anesthesiologist: Reed BreechMazzoni, Andrea, MD CRNA: Maryan RuedWilson, Jennifer M, CRNA   ANESTHESIA:  Topical with tetracaine drops augmented with 1% preservative-free intracameral lidocaine.  ESTIMATED BLOOD LOSS: less than 1 mL.   COMPLICATIONS:  None.   DESCRIPTION OF PROCEDURE:  The patient was identified in the holding room and transported to the operating room and placed in the supine position under the operating microscope.  The right eye was identified as the operative eye and it was prepped and draped in the usual sterile ophthalmic fashion.   A 1.0 millimeter clear-corneal paracentesis was made at the 10:30 position. 0.5 ml of preservative-free 1% lidocaine with epinephrine was injected into the anterior chamber.  The anterior chamber was filled with Healon 5 viscoelastic.  A 2.4 millimeter keratome was used to make a near-clear corneal incision at the 8:00 position.  A curvilinear capsulorrhexis was made with a cystotome and capsulorrhexis forceps.  Balanced salt solution was used to hydrodissect and hydrodelineate the nucleus.   Phacoemulsification was then used in stop and chop fashion to remove the lens nucleus and epinucleus.  The remaining cortex was then removed using the irrigation and aspiration handpiece. Healon was then placed into the capsular bag to distend it for lens  placement.  A lens was then injected into the capsular bag.  The remaining viscoelastic was aspirated.   Wounds were hydrated with balanced salt solution.  The anterior chamber was inflated to a physiologic pressure with balanced salt solution.   Intracameral vigamox 0.1 mL undiluted was injected into the eye and a drop placed onto the ocular surface.  No wound leaks were noted.  The patient was taken to the recovery room in stable condition without complications of anesthesia or surgery  Willey BladeBradley King 08/04/2018, 11:22 AM

## 2018-08-04 NOTE — Anesthesia Postprocedure Evaluation (Signed)
Anesthesia Post Note  Patient: Brianna Benson  Procedure(s) Performed: CATARACT EXTRACTION PHACO AND INTRAOCULAR LENS PLACEMENT (IOC)  RIGHT (Right Eye)  Patient location during evaluation: PACU Anesthesia Type: MAC Level of consciousness: awake and alert, oriented and patient cooperative Pain management: pain level controlled Vital Signs Assessment: post-procedure vital signs reviewed and stable Respiratory status: spontaneous breathing, nonlabored ventilation and respiratory function stable Cardiovascular status: blood pressure returned to baseline and stable Postop Assessment: adequate PO intake Anesthetic complications: no    Darrin Nipper

## 2018-08-04 NOTE — Transfer of Care (Signed)
Immediate Anesthesia Transfer of Care Note  Patient: Brianna Benson  Procedure(s) Performed: CATARACT EXTRACTION PHACO AND INTRAOCULAR LENS PLACEMENT (IOC)  RIGHT (Right Eye)  Patient Location: PACU  Anesthesia Type: MAC  Level of Consciousness: awake, alert  and patient cooperative  Airway and Oxygen Therapy: Patient Spontanous Breathing and Patient connected to supplemental oxygen  Post-op Assessment: Post-op Vital signs reviewed, Patient's Cardiovascular Status Stable, Respiratory Function Stable, Patent Airway and No signs of Nausea or vomiting  Post-op Vital Signs: Reviewed and stable  Complications: No apparent anesthesia complications

## 2018-08-05 ENCOUNTER — Encounter: Payer: Self-pay | Admitting: Ophthalmology

## 2018-08-18 DIAGNOSIS — R5383 Other fatigue: Secondary | ICD-10-CM | POA: Diagnosis not present

## 2018-10-16 DIAGNOSIS — I10 Essential (primary) hypertension: Secondary | ICD-10-CM | POA: Diagnosis not present

## 2018-10-16 DIAGNOSIS — F17209 Nicotine dependence, unspecified, with unspecified nicotine-induced disorders: Secondary | ICD-10-CM | POA: Diagnosis not present

## 2018-10-29 DIAGNOSIS — I10 Essential (primary) hypertension: Secondary | ICD-10-CM | POA: Diagnosis not present

## 2018-10-29 DIAGNOSIS — E785 Hyperlipidemia, unspecified: Secondary | ICD-10-CM | POA: Diagnosis not present

## 2018-10-29 DIAGNOSIS — F4321 Adjustment disorder with depressed mood: Secondary | ICD-10-CM | POA: Diagnosis not present

## 2018-10-29 DIAGNOSIS — R413 Other amnesia: Secondary | ICD-10-CM | POA: Diagnosis not present

## 2018-11-19 DIAGNOSIS — R413 Other amnesia: Secondary | ICD-10-CM | POA: Diagnosis not present

## 2018-11-19 DIAGNOSIS — I10 Essential (primary) hypertension: Secondary | ICD-10-CM | POA: Diagnosis not present

## 2018-11-19 DIAGNOSIS — E039 Hypothyroidism, unspecified: Secondary | ICD-10-CM | POA: Diagnosis not present

## 2018-11-19 DIAGNOSIS — K921 Melena: Secondary | ICD-10-CM | POA: Diagnosis not present

## 2018-12-15 ENCOUNTER — Ambulatory Visit: Payer: Medicare HMO | Admitting: Gastroenterology

## 2018-12-16 ENCOUNTER — Ambulatory Visit: Payer: Medicare HMO | Admitting: Gastroenterology

## 2018-12-22 DIAGNOSIS — K921 Melena: Secondary | ICD-10-CM | POA: Diagnosis not present

## 2018-12-22 DIAGNOSIS — I10 Essential (primary) hypertension: Secondary | ICD-10-CM | POA: Diagnosis not present

## 2018-12-22 DIAGNOSIS — E785 Hyperlipidemia, unspecified: Secondary | ICD-10-CM | POA: Diagnosis not present

## 2019-01-20 DIAGNOSIS — E785 Hyperlipidemia, unspecified: Secondary | ICD-10-CM | POA: Diagnosis not present

## 2019-01-20 DIAGNOSIS — I10 Essential (primary) hypertension: Secondary | ICD-10-CM | POA: Diagnosis not present

## 2019-02-10 DIAGNOSIS — F17209 Nicotine dependence, unspecified, with unspecified nicotine-induced disorders: Secondary | ICD-10-CM | POA: Diagnosis not present

## 2019-02-10 DIAGNOSIS — I10 Essential (primary) hypertension: Secondary | ICD-10-CM | POA: Diagnosis not present

## 2019-03-09 DIAGNOSIS — Z719 Counseling, unspecified: Secondary | ICD-10-CM | POA: Diagnosis not present

## 2019-03-17 DIAGNOSIS — E039 Hypothyroidism, unspecified: Secondary | ICD-10-CM | POA: Diagnosis not present

## 2019-03-17 DIAGNOSIS — R030 Elevated blood-pressure reading, without diagnosis of hypertension: Secondary | ICD-10-CM | POA: Diagnosis not present

## 2019-06-03 DIAGNOSIS — E785 Hyperlipidemia, unspecified: Secondary | ICD-10-CM | POA: Diagnosis not present

## 2019-06-03 DIAGNOSIS — F17209 Nicotine dependence, unspecified, with unspecified nicotine-induced disorders: Secondary | ICD-10-CM | POA: Diagnosis not present

## 2019-07-06 ENCOUNTER — Other Ambulatory Visit: Payer: Self-pay | Admitting: Family Medicine

## 2019-07-06 DIAGNOSIS — Z1231 Encounter for screening mammogram for malignant neoplasm of breast: Secondary | ICD-10-CM

## 2019-07-20 ENCOUNTER — Telehealth: Payer: Self-pay | Admitting: *Deleted

## 2019-07-20 DIAGNOSIS — Z87891 Personal history of nicotine dependence: Secondary | ICD-10-CM

## 2019-07-20 NOTE — Telephone Encounter (Signed)
Patient has been notified that annual lung cancer screening low dose CT scan is due currently or will be in near future. Confirmed that patient is within the age range of 55-77, and asymptomatic, (no signs or symptoms of lung cancer). Patient denies illness that would prevent curative treatment for lung cancer if found. Verified smoking history, (current, 53 pack year). The shared decision making visit was done 07/22/17. Patient is agreeable for CT scan being scheduled.

## 2019-07-29 ENCOUNTER — Ambulatory Visit: Payer: Medicare HMO

## 2019-08-03 ENCOUNTER — Ambulatory Visit
Admission: RE | Admit: 2019-08-03 | Discharge: 2019-08-03 | Disposition: A | Discharge status: Home / Self Care | Payer: Medicare HMO | Source: Ambulatory Visit | Attending: Family Medicine | Admitting: Family Medicine

## 2019-08-03 ENCOUNTER — Other Ambulatory Visit: Payer: Self-pay

## 2019-08-03 DIAGNOSIS — Z1231 Encounter for screening mammogram for malignant neoplasm of breast: Secondary | ICD-10-CM | POA: Insufficient documentation

## 2019-08-04 DIAGNOSIS — I10 Essential (primary) hypertension: Secondary | ICD-10-CM | POA: Diagnosis not present

## 2019-08-04 DIAGNOSIS — E785 Hyperlipidemia, unspecified: Secondary | ICD-10-CM | POA: Diagnosis not present

## 2019-08-04 DIAGNOSIS — E039 Hypothyroidism, unspecified: Secondary | ICD-10-CM | POA: Diagnosis not present

## 2019-08-04 DIAGNOSIS — F4321 Adjustment disorder with depressed mood: Secondary | ICD-10-CM | POA: Diagnosis not present

## 2019-08-04 DIAGNOSIS — E559 Vitamin D deficiency, unspecified: Secondary | ICD-10-CM | POA: Diagnosis not present

## 2019-08-19 ENCOUNTER — Other Ambulatory Visit: Payer: Self-pay

## 2019-08-19 ENCOUNTER — Ambulatory Visit
Admission: RE | Admit: 2019-08-19 | Discharge: 2019-08-19 | Disposition: A | Discharge status: Home / Self Care | Payer: Medicare HMO | Source: Ambulatory Visit | Attending: Nurse Practitioner | Admitting: Nurse Practitioner

## 2019-08-19 DIAGNOSIS — Z87891 Personal history of nicotine dependence: Secondary | ICD-10-CM | POA: Insufficient documentation

## 2019-08-19 DIAGNOSIS — F17210 Nicotine dependence, cigarettes, uncomplicated: Secondary | ICD-10-CM | POA: Diagnosis not present

## 2019-08-21 ENCOUNTER — Encounter: Payer: Self-pay | Admitting: *Deleted

## 2019-10-05 DIAGNOSIS — I10 Essential (primary) hypertension: Secondary | ICD-10-CM | POA: Diagnosis not present

## 2019-10-05 DIAGNOSIS — E785 Hyperlipidemia, unspecified: Secondary | ICD-10-CM | POA: Diagnosis not present

## 2019-10-20 DIAGNOSIS — Z0000 Encounter for general adult medical examination without abnormal findings: Secondary | ICD-10-CM | POA: Diagnosis not present

## 2019-10-20 DIAGNOSIS — Z1389 Encounter for screening for other disorder: Secondary | ICD-10-CM | POA: Diagnosis not present

## 2019-11-19 ENCOUNTER — Other Ambulatory Visit: Payer: Self-pay

## 2019-11-19 ENCOUNTER — Inpatient Hospital Stay
Admission: EM | Admit: 2019-11-19 | Discharge: 2019-11-21 | DRG: 811 | Disposition: A | Discharge status: Home / Self Care | Payer: Medicare HMO | Attending: Internal Medicine | Admitting: Internal Medicine

## 2019-11-19 DIAGNOSIS — Z20822 Contact with and (suspected) exposure to covid-19: Secondary | ICD-10-CM | POA: Diagnosis not present

## 2019-11-19 DIAGNOSIS — Z7982 Long term (current) use of aspirin: Secondary | ICD-10-CM

## 2019-11-19 DIAGNOSIS — R519 Headache, unspecified: Secondary | ICD-10-CM | POA: Diagnosis not present

## 2019-11-19 DIAGNOSIS — K5520 Angiodysplasia of colon without hemorrhage: Secondary | ICD-10-CM | POA: Diagnosis not present

## 2019-11-19 DIAGNOSIS — Q2733 Arteriovenous malformation of digestive system vessel: Secondary | ICD-10-CM | POA: Diagnosis not present

## 2019-11-19 DIAGNOSIS — I10 Essential (primary) hypertension: Secondary | ICD-10-CM | POA: Diagnosis present

## 2019-11-19 DIAGNOSIS — K2900 Acute gastritis without bleeding: Secondary | ICD-10-CM | POA: Diagnosis not present

## 2019-11-19 DIAGNOSIS — D62 Acute posthemorrhagic anemia: Secondary | ICD-10-CM | POA: Diagnosis not present

## 2019-11-19 DIAGNOSIS — I998 Other disorder of circulatory system: Secondary | ICD-10-CM | POA: Diagnosis present

## 2019-11-19 DIAGNOSIS — D649 Anemia, unspecified: Secondary | ICD-10-CM

## 2019-11-19 DIAGNOSIS — I959 Hypotension, unspecified: Secondary | ICD-10-CM | POA: Diagnosis not present

## 2019-11-19 DIAGNOSIS — I951 Orthostatic hypotension: Secondary | ICD-10-CM | POA: Diagnosis present

## 2019-11-19 DIAGNOSIS — Z9181 History of falling: Secondary | ICD-10-CM | POA: Diagnosis not present

## 2019-11-19 DIAGNOSIS — R944 Abnormal results of kidney function studies: Secondary | ICD-10-CM | POA: Diagnosis present

## 2019-11-19 DIAGNOSIS — R531 Weakness: Secondary | ICD-10-CM | POA: Diagnosis not present

## 2019-11-19 DIAGNOSIS — J449 Chronic obstructive pulmonary disease, unspecified: Secondary | ICD-10-CM | POA: Diagnosis not present

## 2019-11-19 DIAGNOSIS — K2901 Acute gastritis with bleeding: Secondary | ICD-10-CM | POA: Diagnosis not present

## 2019-11-19 DIAGNOSIS — Z79899 Other long term (current) drug therapy: Secondary | ICD-10-CM | POA: Diagnosis not present

## 2019-11-19 DIAGNOSIS — K2970 Gastritis, unspecified, without bleeding: Secondary | ICD-10-CM | POA: Diagnosis present

## 2019-11-19 DIAGNOSIS — Z90710 Acquired absence of both cervix and uterus: Secondary | ICD-10-CM

## 2019-11-19 DIAGNOSIS — T39395A Adverse effect of other nonsteroidal anti-inflammatory drugs [NSAID], initial encounter: Secondary | ICD-10-CM | POA: Diagnosis present

## 2019-11-19 DIAGNOSIS — K922 Gastrointestinal hemorrhage, unspecified: Secondary | ICD-10-CM

## 2019-11-19 DIAGNOSIS — F17210 Nicotine dependence, cigarettes, uncomplicated: Secondary | ICD-10-CM | POA: Diagnosis present

## 2019-11-19 DIAGNOSIS — E039 Hypothyroidism, unspecified: Secondary | ICD-10-CM | POA: Diagnosis present

## 2019-11-19 DIAGNOSIS — Z791 Long term (current) use of non-steroidal anti-inflammatories (NSAID): Secondary | ICD-10-CM | POA: Diagnosis not present

## 2019-11-19 DIAGNOSIS — F17200 Nicotine dependence, unspecified, uncomplicated: Secondary | ICD-10-CM | POA: Diagnosis present

## 2019-11-19 DIAGNOSIS — K269 Duodenal ulcer, unspecified as acute or chronic, without hemorrhage or perforation: Secondary | ICD-10-CM | POA: Diagnosis not present

## 2019-11-19 DIAGNOSIS — K2980 Duodenitis without bleeding: Secondary | ICD-10-CM | POA: Diagnosis not present

## 2019-11-19 DIAGNOSIS — Z803 Family history of malignant neoplasm of breast: Secondary | ICD-10-CM

## 2019-11-19 DIAGNOSIS — K264 Chronic or unspecified duodenal ulcer with hemorrhage: Secondary | ICD-10-CM | POA: Diagnosis present

## 2019-11-19 DIAGNOSIS — Z79890 Hormone replacement therapy (postmenopausal): Secondary | ICD-10-CM

## 2019-11-19 DIAGNOSIS — Z1329 Encounter for screening for other suspected endocrine disorder: Secondary | ICD-10-CM | POA: Diagnosis not present

## 2019-11-19 LAB — CBC WITH DIFFERENTIAL/PLATELET
Abs Immature Granulocytes: 0.11 10*3/uL — ABNORMAL HIGH (ref 0.00–0.07)
Basophils Absolute: 0.1 10*3/uL (ref 0.0–0.1)
Basophils Relative: 1 %
Eosinophils Absolute: 0 10*3/uL (ref 0.0–0.5)
Eosinophils Relative: 0 %
HCT: 27.2 % — ABNORMAL LOW (ref 36.0–46.0)
Hemoglobin: 8.4 g/dL — ABNORMAL LOW (ref 12.0–15.0)
Immature Granulocytes: 1 %
Lymphocytes Relative: 11 %
Lymphs Abs: 0.9 10*3/uL (ref 0.7–4.0)
MCH: 28.1 pg (ref 26.0–34.0)
MCHC: 30.9 g/dL (ref 30.0–36.0)
MCV: 91 fL (ref 80.0–100.0)
Monocytes Absolute: 0.8 10*3/uL (ref 0.1–1.0)
Monocytes Relative: 9 %
Neutro Abs: 6.7 10*3/uL (ref 1.7–7.7)
Neutrophils Relative %: 78 %
Platelets: 254 10*3/uL (ref 150–400)
RBC: 2.99 MIL/uL — ABNORMAL LOW (ref 3.87–5.11)
RDW: 23.1 % — ABNORMAL HIGH (ref 11.5–15.5)
Smear Review: NORMAL
WBC: 8.6 10*3/uL (ref 4.0–10.5)
nRBC: 0 % (ref 0.0–0.2)

## 2019-11-19 LAB — LIPASE, BLOOD: Lipase: 28 U/L (ref 11–51)

## 2019-11-19 LAB — COMPREHENSIVE METABOLIC PANEL
ALT: 43 U/L (ref 0–44)
AST: 135 U/L — ABNORMAL HIGH (ref 15–41)
Albumin: 3 g/dL — ABNORMAL LOW (ref 3.5–5.0)
Alkaline Phosphatase: 85 U/L (ref 38–126)
Anion gap: 11 (ref 5–15)
BUN: 28 mg/dL — ABNORMAL HIGH (ref 8–23)
CO2: 19 mmol/L — ABNORMAL LOW (ref 22–32)
Calcium: 7.9 mg/dL — ABNORMAL LOW (ref 8.9–10.3)
Chloride: 108 mmol/L (ref 98–111)
Creatinine, Ser: 1.04 mg/dL — ABNORMAL HIGH (ref 0.44–1.00)
GFR calc Af Amer: >60 mL/min (ref >60)
GFR calc non Af Amer: 54 mL/min — ABNORMAL LOW (ref >60)
Glucose, Bld: 126 mg/dL — ABNORMAL HIGH (ref 70–99)
Potassium: 4 mmol/L (ref 3.5–5.1)
Sodium: 138 mmol/L (ref 135–145)
Total Bilirubin: 0.6 mg/dL (ref 0.3–1.2)
Total Protein: 6 g/dL — ABNORMAL LOW (ref 6.5–8.1)

## 2019-11-19 LAB — HEMOGLOBIN AND HEMATOCRIT, BLOOD
HCT: 22.5 % — ABNORMAL LOW (ref 36.0–46.0)
Hemoglobin: 7.5 g/dL — ABNORMAL LOW (ref 12.0–15.0)

## 2019-11-19 LAB — PREPARE RBC (CROSSMATCH)

## 2019-11-19 LAB — ABO/RH: ABO/RH(D): A POS

## 2019-11-19 LAB — SARS CORONAVIRUS 2 BY RT PCR (HOSPITAL ORDER, PERFORMED IN ~~LOC~~ HOSPITAL LAB): SARS Coronavirus 2: NEGATIVE

## 2019-11-19 LAB — PROTIME-INR
INR: 1.3 — ABNORMAL HIGH (ref 0.8–1.2)
Prothrombin Time: 16 seconds — ABNORMAL HIGH (ref 11.4–15.2)

## 2019-11-19 MED ORDER — ESCITALOPRAM OXALATE 10 MG PO TABS
10.0000 mg | ORAL_TABLET | Freq: Every day | ORAL | Status: DC
  Administered 2019-11-19 – 2019-11-21 (×2): 10 mg via ORAL
  Filled 2019-11-19 (×2): qty 1

## 2019-11-19 MED ORDER — LEVOTHYROXINE SODIUM 100 MCG PO TABS
100.0000 ug | ORAL_TABLET | Freq: Every day | ORAL | Status: DC
  Administered 2019-11-19 – 2019-11-21 (×2): 100 ug via ORAL
  Filled 2019-11-19: qty 2
  Filled 2019-11-19: qty 1

## 2019-11-19 MED ORDER — SODIUM CHLORIDE 0.9 % IV BOLUS: 1000.0000 mL | Freq: Once | INTRAVENOUS | Status: AC

## 2019-11-19 MED ORDER — VITAMIN D 25 MCG (1000 UNIT) PO TABS
1000.0000 [IU] | ORAL_TABLET | Freq: Every day | ORAL | Status: DC
  Administered 2019-11-19 – 2019-11-21 (×2): 1000 [IU] via ORAL
  Filled 2019-11-19 (×2): qty 1

## 2019-11-19 MED ORDER — DONEPEZIL HCL 5 MG PO TABS
5.0000 mg | ORAL_TABLET | Freq: Every day | ORAL | Status: DC
  Administered 2019-11-19 – 2019-11-21 (×2): 5 mg via ORAL
  Filled 2019-11-19 (×2): qty 1

## 2019-11-19 MED ORDER — NICOTINE 21 MG/24HR TD PT24
21.0000 mg | MEDICATED_PATCH | Freq: Once | TRANSDERMAL | Status: AC
  Administered 2019-11-19: 21 mg via TRANSDERMAL
  Filled 2019-11-19: qty 1

## 2019-11-19 MED ORDER — ONDANSETRON HCL 4 MG/2ML IJ SOLN: 4.0000 mg | Freq: Four times a day (QID) | INTRAMUSCULAR | Status: DC | PRN

## 2019-11-19 MED ORDER — SODIUM CHLORIDE 0.9% IV SOLUTION
Freq: Once | INTRAVENOUS | Status: AC
  Filled 2019-11-19: qty 250

## 2019-11-19 MED ORDER — PANTOPRAZOLE SODIUM 40 MG IV SOLR
40.0000 mg | Freq: Once | INTRAVENOUS | Status: AC
  Administered 2019-11-19: 40 mg via INTRAVENOUS
  Filled 2019-11-19: qty 40

## 2019-11-19 MED ORDER — SODIUM CHLORIDE 0.9 % IV SOLN
80.0000 mg | Freq: Once | INTRAVENOUS | Status: AC
  Administered 2019-11-19: 80 mg via INTRAVENOUS
  Filled 2019-11-19: qty 80

## 2019-11-19 MED ORDER — ONDANSETRON HCL 4 MG/2ML IJ SOLN
4.0000 mg | Freq: Once | INTRAMUSCULAR | Status: AC
  Administered 2019-11-19: 4 mg via INTRAVENOUS

## 2019-11-19 MED ORDER — SODIUM CHLORIDE 0.9 % IV SOLN
8.0000 mg/h | INTRAVENOUS | Status: DC
  Administered 2019-11-19 – 2019-11-20 (×2): 8 mg/h via INTRAVENOUS
  Filled 2019-11-19 (×2): qty 80

## 2019-11-19 MED ORDER — PANTOPRAZOLE SODIUM 40 MG IV SOLR: 40.0000 mg | Freq: Two times a day (BID) | INTRAVENOUS | Status: DC

## 2019-11-19 MED ORDER — PANTOPRAZOLE SODIUM 40 MG IV SOLR
40.0000 mg | INTRAVENOUS | Status: DC
  Administered 2019-11-19: 40 mg via INTRAVENOUS

## 2019-11-19 MED ORDER — DEXTROSE-NACL 5-0.9 % IV SOLN: INTRAVENOUS | Status: DC

## 2019-11-19 MED ORDER — ONDANSETRON HCL 4 MG PO TABS: 4.0000 mg | ORAL_TABLET | Freq: Four times a day (QID) | ORAL | Status: DC | PRN

## 2019-11-19 MED ORDER — SODIUM CHLORIDE 0.9 % IV SOLN
10.0000 mL/h | Freq: Once | INTRAVENOUS | Status: AC
  Administered 2019-11-19: 10 mL/h via INTRAVENOUS

## 2019-11-19 NOTE — ED Provider Notes (Addendum)
[facility] Emergency Department Provider Note  ____________________________________________  Time seen: Approximately 2:39 PM  I have reviewed the triage vital signs and the nursing notes.   HISTORY  Chief Complaint Hypotension    HPI Brianna Benson is a 74 y.o. female with a history of hypertension and hypothyroidism who comes to the ED due to hypotension  and black stool for the past 3 days.  She has generalized weakness, lightheadedness with standing or walking.  At rest she is feeling okay.  No chest pain shortness of breath fevers or chills.  Denies abdominal pain or vomiting.  Denies blood thinner use.  No trauma.  No NSAID or steroid use.  She initially had gone to her primary care office where they noted her blood pressure to be about 70/40.   Past Medical History:  Diagnosis Date  . Hypertension   . Hypothyroidism   . Wears dentures      Patient Active Problem List   Diagnosis Date Noted  . Personal history of tobacco use, presenting hazards to health 07/22/2018     Past Surgical History:  Procedure Laterality Date  . ABDOMINAL HYSTERECTOMY    . CATARACT EXTRACTION W/PHACO Left 09/03/2016   Procedure: CATARACT EXTRACTION PHACO AND INTRAOCULAR LENS PLACEMENT (IOC)  Left;  Surgeon: Sherald Hess, MD;  Location: [facility];  Service: Ophthalmology;  Laterality: Left;  . CATARACT EXTRACTION W/PHACO Right 08/04/2018   Procedure: CATARACT EXTRACTION PHACO AND INTRAOCULAR LENS PLACEMENT (IOC)  RIGHT;  Surgeon: [place] Crane, MD;  Location: [facility];  Service: Ophthalmology;  Laterality: Right;  . COLONOSCOPY    . LASIK    . NASAL SINUS SURGERY       Prior to Admission medications   Medication Sig Start Date End Date Taking? Authorizing Provider  amLODipine (NORVASC) 5 MG tablet Take 5 mg by mouth daily.   Yes [provider]  Aspirin-Salicylamide-Caffeine (BC HEADACHE POWDER PO) Take by mouth.    Yes [provider]  cholecalciferol (VITAMIN D) 25 MCG (1000 UNIT) tablet Take 1,000 Units by mouth daily.   Yes [provider]  donepezil (ARICEPT) 5 MG tablet Take 5 mg by mouth daily. 11/16/19  Yes [provider]  escitalopram (LEXAPRO) 10 MG tablet Take 10 mg by mouth daily. 11/16/19  Yes [provider]  levothyroxine (SYNTHROID) 100 MCG tablet Take 100 mcg by mouth daily. 10/21/19  Yes [provider]  lisinopril (ZESTRIL) 20 MG tablet Take 20 mg by mouth daily. 10/12/19   [provider]     Allergies Patient has no known allergies.   Family History  Problem Relation Age of Onset  . Breast cancer Maternal Aunt     Social History Social History   Tobacco Use  . Smoking status: Current Every Day Smoker    Packs/day: 1.00    Years: 52.00    Pack years: 52.00    Types: Cigarettes  . Smokeless tobacco: Never Used  Substance Use Topics  . Alcohol use: Yes    Alcohol/week: 18.0 standard drinks    Types: 18 Cans of beer per week    Comment: 2-3beers/day  . Drug use: No    Review of Systems  Constitutional:   No fever or chills.  ENT:   No sore throat. No rhinorrhea. Cardiovascular:   No chest pain or syncope. Respiratory:   No dyspnea or cough. Gastrointestinal:   Negative for abdominal pain or vomiting.  Positive black tarry stool Musculoskeletal:  Negative for focal pain or swelling All other systems reviewed and are negative except as documented above in ROS and HPI.  ____________________________________________   PHYSICAL EXAM:  VITAL SIGNS: ED Triage Vitals  Enc Vitals Group     BP 11/19/19 1117 107/61     Pulse Rate 11/19/19 1118 91     Resp 11/19/19 1117 19     Temp 11/19/19 1119 98.1 F (36.7 C)     Temp Source 11/19/19 1119 Oral     SpO2 11/19/19 1118 96 %     Weight 11/19/19 1120 130 lb (59 kg)     Height 11/19/19 1120 5\' 5"  (1.651 m)     Head Circumference --      Peak Flow --      Pain  Score 11/19/19 1120 0     Pain Loc --      Pain Edu? --      Excl. in [place]? --     Vital signs reviewed, nursing assessments reviewed.   Constitutional:   Alert and oriented. Non-toxic appearance. Eyes:   Conjunctivae are normal. EOMI. PERRL. ENT      Head:   Normocephalic and atraumatic.      Nose:   Wearing a mask.      Mouth/Throat:   Wearing a mask.      Neck:   No meningismus. Full ROM. Hematological/Lymphatic/Immunilogical:   No cervical lymphadenopathy. Cardiovascular:   RRR. Symmetric bilateral radial and DP pulses.  No murmurs. Cap refill less than 2 seconds. Respiratory:   Normal respiratory effort without tachypnea/retractions. Breath sounds are clear and equal bilaterally. No wheezes/rales/rhonchi. Gastrointestinal:   Soft and nontender. Non distended. There is no CVA tenderness.  No rebound, rigidity, or guarding.  Rectal exam reveals melanotic stool, strongly Hemoccult positive Musculoskeletal:   Normal range of motion in all extremities. No joint effusions.  No lower extremity tenderness.  No edema. Neurologic:   Normal speech and language.  Motor grossly intact. No acute focal neurologic deficits are appreciated.  Skin:    Skin is warm, dry and intact. No rash noted.  No petechiae, purpura, or bullae.  ____________________________________________    LABS (pertinent positives/negatives) (all labs ordered are listed, but only abnormal results are displayed) Labs Reviewed  CBC WITH DIFFERENTIAL/PLATELET - Abnormal; Notable for the following components:      Result Value   RBC 2.99 (*)    Hemoglobin 8.4 (*)    HCT 27.2 (*)    RDW 23.1 (*)    Abs Immature Granulocytes 0.11 (*)    All other components within normal limits  SARS CORONAVIRUS 2 BY RT PCR (HOSPITAL ORDER,  [place] [facility])  COMPREHENSIVE METABOLIC PANEL  LIPASE, BLOOD  PROTIME-INR  PREPARE RBC (CROSSMATCH)  TYPE AND SCREEN    ____________________________________________   EKG    ____________________________________________    RADIOLOGY  No results found.  ____________________________________________   PROCEDURES .Critical Care Performed by: Carrie Mew, MD Authorized by: Carrie Mew, MD   Critical care provider statement:    Critical care time (minutes):  33   Critical care time was exclusive of:  Separately billable procedures and treating other patients   Critical care was necessary to treat or prevent imminent or life-threatening deterioration of the following conditions:  Shock and circulatory failure   Critical care was time spent personally by me on the following activities:  Development of treatment plan with patient or surrogate, discussions with consultants, evaluation of patient's response to treatment, examination  of patient, obtaining history from patient or surrogate, ordering and performing treatments and interventions, ordering and review of laboratory studies, ordering and review of radiographic studies, pulse oximetry, re-evaluation of patient's condition and review of old charts    ____________________________________________    CLINICAL IMPRESSION / ASSESSMENT AND PLAN / ED COURSE  Medications ordered in the ED: Medications  0.9 %  sodium chloride infusion (has no administration in time range)  pantoprazole (PROTONIX) injection 40 mg (40 mg Intravenous Given 11/19/19 1200)  sodium chloride 0.9 % bolus 1,000 mL (1,000 mLs Intravenous Given by EMS 11/19/19 1137)    Pertinent labs & imaging results that were available during my care of the patient were reviewed by me and considered in my medical decision making (see chart for details).  Brianna Benson was evaluated in Emergency Department on 11/19/2019 for the symptoms described in the history of present illness. She was evaluated in the context of the global COVID-19 pandemic, which necessitated consideration  that the patient might be at risk for infection with the SARS-CoV-2 virus that causes COVID-19. Institutional protocols and algorithms that pertain to the evaluation of patients at risk for COVID-19 are in a state of rapid change based on information released by regulatory bodies including the CDC and federal and state organizations. These policies and algorithms were followed during the patient's care in the ED.     Clinical Course as of Nov 19 1442  Thu Nov 19, 2019  1135 Patient presents with melanotic stool, episode of hypotension at primary care.  In the ED her vital signs are normal at rest, but does have tachycardia with changing position to upright.  Exam confirms melanotic stool, strongly Hemoccult positive.  Will check labs, give IV fluids for blood pressure support.  IV Protonix, plan to admit for GI evaluation.     [PS]  1444 Will give 1 unit packed red blood cells transfusion due to the orthostatic hypotension and likely ongoing GI blood loss with initial hemoglobin of 8.4.     [PS]    Clinical Course User Index [PS] Sharman Cheek, MD     ____________________________________________   FINAL CLINICAL IMPRESSION(S) / ED DIAGNOSES    Final diagnoses:  Upper GI bleed  Symptomatic anemia  Orthostatic hypotension     ED Discharge Orders    None      Portions of this note were generated with dragon dictation software. Dictation errors may occur despite best attempts at proofreading.   Sharman Cheek, MD 11/19/19 1444    Sharman Cheek, MD 11/28/19 [phone]

## 2019-11-19 NOTE — ED Notes (Signed)
Call placed to 1A and 2C to get maintenance fluids.

## 2019-11-19 NOTE — Progress Notes (Signed)
Received a call from RN that patient had coffee ground emesis. SBP remains in the 90"s Will transfer patient to ICU Transfuse 2 units of Packed RBC Check serial H/H Place patient on Protonix drip Insert NG tube and connect to [facility]

## 2019-11-19 NOTE — H&P (Signed)
History and Physical    Brianna Benson FIE:[id number] DOB: 08-15-1945 DOA: 11/19/2019  PCP: Letta Median, MD   Patient coming from: Home  I have personally briefly reviewed patient's old medical records in [place]  Chief Complaint:  HPI: Brianna Benson is a 74 y.o. female with medical history significant for hypertension and hypothyroidism who was sent to the emergency room from her primary care provider's office where she was noted to be hypotensive with systolic blood pressure in the 70s.  Patient presents with complaints of weakness, dizziness and lightheadedness mostly with standing.  Patient also states that she has been passing black stools for about 3 days.  She denies having abdominal pain, she denies having any hematemesis or hematochezia. Patient is on NSAIDS which she takes for headaches.  She denies having any chest pain, shortness of breath, palpitations, diaphoresis, fever or chills cough or any mental status changes. Her hemoglobin on admission is 8.4g/dl and there is no baseline to compare with.  ED Course: Patient is a 74 year old female who presents to the hospital from her primary care provider's office for evaluation of hypotension as well as passage of melena stools.  Stools were grossly heme positive in the emergency room.  Review of Systems: As per HPI otherwise 10 point review of systems negative.    Past Medical History:  Diagnosis Date  . Hypertension   . Hypothyroidism   . Wears dentures     Past Surgical History:  Procedure Laterality Date  . ABDOMINAL HYSTERECTOMY    . CATARACT EXTRACTION W/PHACO Left 09/03/2016   Procedure: CATARACT EXTRACTION PHACO AND INTRAOCULAR LENS PLACEMENT ([place])  Left;  Surgeon: Ronnell Freshwater, MD;  Location: [place];  Service: Ophthalmology;  Laterality: Left;  . CATARACT EXTRACTION W/PHACO Right 08/04/2018   Procedure: CATARACT EXTRACTION PHACO AND INTRAOCULAR LENS PLACEMENT ([place])  RIGHT;   Surgeon: Eulogio Bear, MD;  Location: [place];  Service: Ophthalmology;  Laterality: Right;  . COLONOSCOPY    . LASIK    . NASAL SINUS SURGERY       reports that she has been smoking cigarettes. She has a 52.00 pack-year smoking history. She has never used smokeless tobacco. She reports current alcohol use of about 18.0 standard drinks of alcohol per week. She reports that she does not use drugs.  No Known Allergies  Family History  Problem Relation Age of Onset  . Breast cancer Maternal Aunt      Prior to Admission medications   Medication Sig Start Date End Date Taking? Authorizing Provider  amLODipine (NORVASC) 5 MG tablet Take 5 mg by mouth daily.   Yes [provider]  Aspirin-Salicylamide-Caffeine (BC HEADACHE POWDER PO) Take by mouth.   Yes [provider]  cholecalciferol (VITAMIN D) 25 MCG (1000 UNIT) tablet Take 1,000 Units by mouth daily.   Yes [provider]  donepezil (ARICEPT) 5 MG tablet Take 5 mg by mouth daily. 11/16/19  Yes [provider]  escitalopram (LEXAPRO) 10 MG tablet Take 10 mg by mouth daily. 11/16/19  Yes [provider]  levothyroxine (SYNTHROID) 100 MCG tablet Take 100 mcg by mouth daily. 10/21/19  Yes [provider]  lisinopril (ZESTRIL) 20 MG tablet Take 20 mg by mouth daily. 10/12/19   [provider]    Physical Exam: Vitals:   11/19/19 1119 11/19/19 1120 11/19/19 1549 11/19/19 1550  BP: 107/61  93/81   Pulse: 87   (!) 121  Resp: 16  (!)  32 (!) 28  Temp: 98.1 F (36.7 C)     TempSrc: Oral     SpO2: 100%   100%  Weight:  59 kg    Height:  5\' 5"  (1.651 m)       Vitals:   11/19/19 1119 11/19/19 1120 11/19/19 1549 11/19/19 1550  BP: 107/61  93/81   Pulse: 87   (!) 121  Resp: 16  (!) 32 (!) 28  Temp: 98.1 F (36.7 C)     TempSrc: Oral     SpO2: 100%   100%  Weight:  59 kg    Height:  5\' 5"  (1.651 m)      Constitutional: NAD, alert and oriented x 3.   Appears acutely ill and pale Eyes: PERRL, lids and conjunctivae pallor ENMT: Mucous membranes are moist.  Neck: normal, supple, no masses, no thyromegaly Respiratory: clear to auscultation bilaterally, no wheezing, no crackles. Normal respiratory effort. No accessory muscle use.  Cardiovascular: Regular rate and rhythm, no murmurs / rubs / gallops. No extremity edema. 2+ pedal pulses. No carotid bruits.  Abdomen: no tenderness, no masses palpated. No hepatosplenomegaly. Bowel sounds positive.  Musculoskeletal: no clubbing / cyanosis. No joint deformity upper and lower extremities.  Skin: no rashes, lesions, ulcers.  Neurologic: No gross focal neurologic deficit. Psychiatric: Normal mood and affect.   Labs on Admission: I have personally reviewed following labs and imaging studies  CBC: Recent Labs  Lab 11/19/19 1149  WBC 8.6  NEUTROABS 6.7  HGB 8.4*  HCT 27.2*  MCV 91.0  PLT 254   Basic Metabolic Panel: Recent Labs  Lab 11/19/19 1537  NA 138  K 4.0  CL 108  CO2 19*  GLUCOSE 126*  BUN 28*  CREATININE 1.04*  CALCIUM 7.9*   GFR: Estimated Creatinine Clearance: 44 mL/min (A) (by C-G formula based on SCr of 1.04 mg/dL (H)). Liver Function Tests: Recent Labs  Lab 11/19/19 1537  AST 135*  ALT 43  ALKPHOS 85  BILITOT 0.6  PROT 6.0*  ALBUMIN 3.0*   Recent Labs  Lab 11/19/19 1537  LIPASE 28   No results for input(s): AMMONIA in the last 168 hours. Coagulation Profile: Recent Labs  Lab 11/19/19 1537  INR 1.3*   Cardiac Enzymes: No results for input(s): CKTOTAL, CKMB, CKMBINDEX, TROPONINI in the last 168 hours. BNP (last 3 results) No results for input(s): PROBNP in the last 8760 hours. HbA1C: No results for input(s): HGBA1C in the last 72 hours. CBG: No results for input(s): GLUCAP in the last 168 hours. Lipid Profile: No results for input(s): CHOL, HDL, LDLCALC, TRIG, CHOLHDL, LDLDIRECT in the last 72 hours. Thyroid Function Tests: No results for  input(s): TSH, T4TOTAL, FREET4, T3FREE, THYROIDAB in the last 72 hours. Anemia Panel: No results for input(s): VITAMINB12, FOLATE, FERRITIN, TIBC, IRON, RETICCTPCT in the last 72 hours. Urine analysis: No results found for: COLORURINE, APPEARANCEUR, LABSPEC, PHURINE, GLUCOSEU, HGBUR, BILIRUBINUR, KETONESUR, PROTEINUR, UROBILINOGEN, NITRITE, LEUKOCYTESUR  Radiological Exams on Admission: No results found.  EKG: Independently reviewed.   Assessment/Plan Active Problems:   Acute blood loss anemia   Hypertension   Hypothyroidism   Nicotine dependence    Acute blood loss anemia Most likely related to NSAID use suspect acute gastritis needs to rule out PUD Will type and cross match and will transfuse 1 unit of packed RBC Obtain serial H&H  Place patient on IV PPI Request GI consult   Hypothyroidism Hold Synthroid   Hypertension Hold all blood pressure medicines at  this time due to relative hypotension.   Nicotine dependence Smoking cessation has been discussed with patient in detail We will place patient on nicotine transdermal patch 21 mg daily   DVT prophylaxis: SCD Code Status: Full Family Communication: Greater than 50% of time was spent discussing patient's condition and plan of care with her at the bedside. All questions and concerns have been addressed Disposition Plan: Back to previous home environment Consults called: GI    Brennyn Ortlieb MD [facility]     11/19/2019, 4:35 PM

## 2019-11-19 NOTE — ED Notes (Addendum)
Pt only to get 2 units of blood total per Milas Gain, MD, this includes the ED ordered blood transfusion.

## 2019-11-19 NOTE — ED Notes (Signed)
Updated hospitalist via secure chat about pt recent BM and what daughter in law spoke with RN about via phone.

## 2019-11-19 NOTE — ED Triage Notes (Signed)
Pt arrived from [facility] with a bp in the low 70's. Pt NAD on arrival. Pt states that she has been having dark, tarry stools over the past few days.

## 2019-11-19 NOTE — ED Notes (Signed)
This RN to bedside and noted that pt was vomiting what looked like dark colored coffee grounds. Dee RN and Agbata MD messaged via secure chat

## 2019-11-19 NOTE — ED Notes (Signed)
Pt refusing NG tube.

## 2019-11-19 NOTE — ED Notes (Signed)
Per Geraldine Contras, RN pt refused NG tube when she attempted earlier today and admitting aware. Pt continues to refuse NG tube.

## 2019-11-19 NOTE — ED Notes (Signed)
Daughter in law Victorino Dike called this RN to inquire about patient status and care. States that pt has early signs of dementia and that son, [name] is emergency contact. Demographics updated. Also states that pt is a "heavy daily drinker" unsure how much but wanted to let staff know.

## 2019-11-20 ENCOUNTER — Encounter: Payer: Self-pay | Admitting: Anesthesiology

## 2019-11-20 ENCOUNTER — Inpatient Hospital Stay: Payer: Medicare HMO | Admitting: Anesthesiology

## 2019-11-20 ENCOUNTER — Other Ambulatory Visit: Payer: Self-pay

## 2019-11-20 ENCOUNTER — Encounter
Admission: EM | Disposition: A | Discharge status: Home / Self Care | Payer: Self-pay | Source: Home / Self Care | Attending: Internal Medicine

## 2019-11-20 ENCOUNTER — Encounter: Payer: Self-pay | Admitting: Internal Medicine

## 2019-11-20 DIAGNOSIS — Z9181 History of falling: Secondary | ICD-10-CM | POA: Diagnosis not present

## 2019-11-20 DIAGNOSIS — I951 Orthostatic hypotension: Secondary | ICD-10-CM

## 2019-11-20 DIAGNOSIS — K5520 Angiodysplasia of colon without hemorrhage: Secondary | ICD-10-CM

## 2019-11-20 DIAGNOSIS — D649 Anemia, unspecified: Secondary | ICD-10-CM

## 2019-11-20 DIAGNOSIS — K922 Gastrointestinal hemorrhage, unspecified: Secondary | ICD-10-CM | POA: Diagnosis not present

## 2019-11-20 DIAGNOSIS — K2900 Acute gastritis without bleeding: Secondary | ICD-10-CM

## 2019-11-20 DIAGNOSIS — Z791 Long term (current) use of non-steroidal anti-inflammatories (NSAID): Secondary | ICD-10-CM

## 2019-11-20 DIAGNOSIS — E039 Hypothyroidism, unspecified: Secondary | ICD-10-CM

## 2019-11-20 HISTORY — PX: ESOPHAGOGASTRODUODENOSCOPY (EGD) WITH PROPOFOL: SHX5813

## 2019-11-20 LAB — CBC
HCT: 24.5 % — ABNORMAL LOW (ref 36.0–46.0)
Hemoglobin: 8.1 g/dL — ABNORMAL LOW (ref 12.0–15.0)
MCH: 28.8 pg (ref 26.0–34.0)
MCHC: 33.1 g/dL (ref 30.0–36.0)
MCV: 87.2 fL (ref 80.0–100.0)
Platelets: 153 10*3/uL (ref 150–400)
RBC: 2.81 MIL/uL — ABNORMAL LOW (ref 3.87–5.11)
RDW: 19.2 % — ABNORMAL HIGH (ref 11.5–15.5)
WBC: 8.5 10*3/uL (ref 4.0–10.5)
nRBC: 0 % (ref 0.0–0.2)

## 2019-11-20 LAB — BASIC METABOLIC PANEL
Anion gap: 6 (ref 5–15)
BUN: 28 mg/dL — ABNORMAL HIGH (ref 8–23)
CO2: 23 mmol/L (ref 22–32)
Calcium: 7.6 mg/dL — ABNORMAL LOW (ref 8.9–10.3)
Chloride: 115 mmol/L — ABNORMAL HIGH (ref 98–111)
Creatinine, Ser: 0.79 mg/dL (ref 0.44–1.00)
GFR calc Af Amer: >60 mL/min (ref >60)
GFR calc non Af Amer: >60 mL/min (ref >60)
Glucose, Bld: 100 mg/dL — ABNORMAL HIGH (ref 70–99)
Potassium: 3.6 mmol/L (ref 3.5–5.1)
Sodium: 144 mmol/L (ref 135–145)

## 2019-11-20 LAB — HEMOGLOBIN AND HEMATOCRIT, BLOOD
HCT: 31.2 % — ABNORMAL LOW (ref 36.0–46.0)
Hemoglobin: 10 g/dL — ABNORMAL LOW (ref 12.0–15.0)

## 2019-11-20 SURGERY — ESOPHAGOGASTRODUODENOSCOPY (EGD) WITH PROPOFOL
Anesthesia: General

## 2019-11-20 MED ORDER — ACETAMINOPHEN 650 MG RE SUPP
650.0000 mg | Freq: Once | RECTAL | Status: AC
  Administered 2019-11-20: 650 mg via RECTAL
  Filled 2019-11-20: qty 1

## 2019-11-20 MED ORDER — PROPOFOL 10 MG/ML IV BOLUS
INTRAVENOUS | Status: DC | PRN
  Administered 2019-11-20: 20 mg via INTRAVENOUS
  Administered 2019-11-20 (×2): 30 mg via INTRAVENOUS
  Administered 2019-11-20 (×2): 20 mg via INTRAVENOUS
  Administered 2019-11-20: 40 mg via INTRAVENOUS

## 2019-11-20 MED ORDER — ACETAMINOPHEN 325 MG PO TABS: 650.0000 mg | ORAL_TABLET | Freq: Four times a day (QID) | ORAL | Status: DC | PRN

## 2019-11-20 MED ORDER — SODIUM CHLORIDE 0.9 % IV SOLN: INTRAVENOUS | Status: DC | PRN

## 2019-11-20 MED ORDER — PROPOFOL 500 MG/50ML IV EMUL
INTRAVENOUS | Status: AC
  Filled 2019-11-20: qty 50

## 2019-11-20 MED ORDER — PANTOPRAZOLE SODIUM 40 MG PO TBEC
40.0000 mg | DELAYED_RELEASE_TABLET | Freq: Two times a day (BID) | ORAL | Status: DC
  Administered 2019-11-20 – 2019-11-21 (×2): 40 mg via ORAL
  Filled 2019-11-20 (×2): qty 1

## 2019-11-20 NOTE — H&P (Signed)
Jonathon Bellows, MD [address], [place], [place], [place], [address] [address], [place], [place], [place], [address] Phone: [phone]  Fax: [phone]  Primary Care Physician:  Letta Median, MD   Pre-Procedure History & Physical: HPI:  Brianna Benson is a 74 y.o. female is here for an endoscopy    Past Medical History:  Diagnosis Date  . Hypertension   . Hypothyroidism   . Wears dentures     Past Surgical History:  Procedure Laterality Date  . ABDOMINAL HYSTERECTOMY    . CATARACT EXTRACTION W/PHACO Left 09/03/2016   Procedure: CATARACT EXTRACTION PHACO AND INTRAOCULAR LENS PLACEMENT ([facility])  Left;  Surgeon: Ronnell Freshwater, MD;  Location: [facility];  Service: Ophthalmology;  Laterality: Left;  . CATARACT EXTRACTION W/PHACO Right 08/04/2018   Procedure: CATARACT EXTRACTION PHACO AND INTRAOCULAR LENS PLACEMENT ([facility])  RIGHT;  Surgeon: Eulogio Bear, MD;  Location: [facility];  Service: Ophthalmology;  Laterality: Right;  . COLONOSCOPY    . LASIK    . NASAL SINUS SURGERY      Prior to Admission medications   Medication Sig Start Date End Date Taking? Authorizing Provider  amLODipine (NORVASC) 5 MG tablet Take 5 mg by mouth daily.   Yes [provider]  Aspirin-Salicylamide-Caffeine (BC HEADACHE POWDER PO) Take by mouth.   Yes [provider]  cholecalciferol (VITAMIN D) 25 MCG (1000 UNIT) tablet Take 1,000 Units by mouth daily.   Yes [provider]  donepezil (ARICEPT) 5 MG tablet Take 5 mg by mouth daily. 11/16/19  Yes [provider]  escitalopram (LEXAPRO) 10 MG tablet Take 10 mg by mouth daily. 11/16/19  Yes [provider]  levothyroxine (SYNTHROID) 100 MCG tablet Take 100 mcg by mouth daily. 10/21/19  Yes [provider]  lisinopril (ZESTRIL) 20 MG tablet Take 20 mg by mouth daily. 10/12/19   [provider]    Allergies as of 11/19/2019  . (No Known  Allergies)    Family History  Problem Relation Age of Onset  . Breast cancer Maternal Aunt     Social History   Socioeconomic History  . Marital status: Widowed    Spouse name: Not on file  . Number of children: Not on file  . Years of education: Not on file  . Highest education level: Not on file  Occupational History  . Not on file  Tobacco Use  . Smoking status: Current Every Day Smoker    Packs/day: 1.00    Years: 52.00    Pack years: 52.00    Types: Cigarettes  . Smokeless tobacco: Never Used  Substance and Sexual Activity  . Alcohol use: Yes    Alcohol/week: 18.0 standard drinks    Types: 18 Cans of beer per week    Comment: 2-3beers/day  . Drug use: No  . Sexual activity: Not on file  Other Topics Concern  . Not on file  Social History Narrative  . Not on file   Social Determinants of Health   Financial Resource Strain:   . Difficulty of Paying Living Expenses:   Food Insecurity:   . Worried About Charity fundraiser in the Last Year:   . Arboriculturist in the Last Year:   Transportation Needs:   . Film/video editor (Medical):   Marland Kitchen Lack of Transportation (Non-Medical):   Physical Activity:   . Days of Exercise per Week:   . Minutes of Exercise per Session:  Stress:   . Feeling of Stress :   Social Connections:   . Frequency of Communication with Friends and Family:   . Frequency of Social Gatherings with Friends and Family:   . Attends Religious Services:   . Active Member of Clubs or Organizations:   . Attends Banker Meetings:   Marland Kitchen Marital Status:   Intimate Partner Violence:   . Fear of Current or Ex-Partner:   . Emotionally Abused:   Marland Kitchen Physically Abused:   . Sexually Abused:     Review of Systems: See HPI, otherwise negative ROS  Physical Exam: BP (!) 127/49   Pulse 65   Temp 98.4 F (36.9 C) (Oral)   Resp 18   Ht 5\' 5"  (1.651 m)   Wt 59 kg   SpO2 97%   BMI 21.63 kg/m  General:   Alert,  pleasant and  cooperative in NAD Head:  Normocephalic and atraumatic. Neck:  Supple; no masses or thyromegaly. Lungs:  Clear throughout to auscultation, normal respiratory effort.    Heart:  +S1, +S2, Regular rate and rhythm, No edema. Abdomen:  Soft, nontender and nondistended. Normal bowel sounds, without guarding, and without rebound.   Neurologic:  Alert and  oriented x4;  grossly normal neurologically.  Impression/Plan: Brianna Benson is here for an endoscopy  to be performed for  evaluation of melena    Risks, benefits, limitations, and alternatives regarding endoscopy have been reviewed with the patient.  Questions have been answered.  All parties agreeable.   Kirke Corin, MD  11/20/2019, 12:06 PM

## 2019-11-20 NOTE — Consult Note (Addendum)
Wyline Mood , MD [address], [address], [place], [place], [address] [address], [address], [place], [place], [address] Phone: [phone]  Fax: [phone]  Consultation  Referring Provider:  Dr Joylene Igo   Primary Care Physician:  Oswaldo Conroy, MD Primary Gastroenterologist: None          Reason for Consultation:     Melena  Date of Admission:  11/19/2019 Date of Consultation:  11/20/2019         HPI:   Brianna Benson is a 74 y.o. female presented to the ER after she was seen by her PCP who noted that she was hypptensive. She states that she has had black tarry stools for over 2 weeks, denies any abdominal pain , no hematemesis. She uses BC powders on a daily basis for a long time for head aches. When I went into see her she was have a bowel movement that was formed but tarry black.   On admission Hb was 8.4 grams with mcv 91, elevated BUN/Cr ratio, had 2 units PRBC And this am HB 10 grams . Consumes alcohol in excess.  Past Medical History:  Diagnosis Date  . Hypertension   . Hypothyroidism   . Wears dentures     Past Surgical History:  Procedure Laterality Date  . ABDOMINAL HYSTERECTOMY    . CATARACT EXTRACTION W/PHACO Left 09/03/2016   Procedure: CATARACT EXTRACTION PHACO AND INTRAOCULAR LENS PLACEMENT ([facility])  Left;  Surgeon: Sherald Hess, MD;  [facility];  Service: Ophthalmology;  Laterality: Left;  . CATARACT EXTRACTION W/PHACO Right 08/04/2018   Procedure: CATARACT EXTRACTION PHACO AND INTRAOCULAR LENS PLACEMENT ([facility])  RIGHT;  Surgeon: Nevada Crane, MD;  Location: [facility];  Service: Ophthalmology;  Laterality: Right;  . COLONOSCOPY    . LASIK    . NASAL SINUS SURGERY      Prior to Admission medications   Medication Sig Start Date End Date Taking? Authorizing Provider  amLODipine (NORVASC) 5 MG tablet Take 5 mg by mouth daily.   Yes [provider]  Aspirin-Salicylamide-Caffeine (BC HEADACHE POWDER PO)  Take by mouth.   Yes [provider]  cholecalciferol (VITAMIN D) 25 MCG (1000 UNIT) tablet Take 1,000 Units by mouth daily.   Yes [provider]  donepezil (ARICEPT) 5 MG tablet Take 5 mg by mouth daily. 11/16/19  Yes [provider]  escitalopram (LEXAPRO) 10 MG tablet Take 10 mg by mouth daily. 11/16/19  Yes [provider]  levothyroxine (SYNTHROID) 100 MCG tablet Take 100 mcg by mouth daily. 10/21/19  Yes [provider]  lisinopril (ZESTRIL) 20 MG tablet Take 20 mg by mouth daily. 10/12/19   [provider]    Family History  Problem Relation Age of Onset  . Breast cancer Maternal Aunt      Social History   Tobacco Use  . Smoking status: Current Every Day Smoker    Packs/day: 1.00    Years: 52.00    Pack years: 52.00    Types: Cigarettes  . Smokeless tobacco: Never Used  Substance Use Topics  . Alcohol use: Yes    Alcohol/week: 18.0 standard drinks    Types: 18 Cans of beer per week    Comment: 2-3beers/day  . Drug use: No    Allergies as of 11/19/2019  . (No Known Allergies)    Review of Systems:    All systems reviewed and negative except where noted in HPI.   Physical Exam:  Vital signs in last 24 hours: Temp:  [98 F (36.7 C)-98.6 F (37 C)] 98 F (36.7 C) (05/27 2240) Pulse Rate:  [72-121] 82 (05/28 0407) Resp:  [14-32] 17 (05/28 0029) BP: (84-153)/(38-89) 132/51 (05/28 0029) SpO2:  [96 %-100 %] 100 % (05/28 0407) Weight:  [59 kg] 59 kg (05/27 1120)   General:   Pleasant, cooperative in NAD Head:  Normocephalic and atraumatic. Eyes:   No icterus.   Conjunctiva pink. PERRLA. Ears:  Normal auditory acuity. Neck:  Supple; no masses or thyroidomegaly Lungs: Respirations even and unlabored. Lungs clear to auscultation bilaterally.   No wheezes, crackles, or rhonchi.  Heart:  Regular rate and rhythm;  Without murmur, clicks, rubs or gallops Abdomen:  Soft, nondistended, nontender. Normal bowel sounds. No  appreciable masses or hepatomegaly.  No rebound or guarding.  Neurologic:  Alert and oriented x3;  grossly normal neurologically. Skin:  Intact without significant lesions or rashes. Cervical Nodes:  No significant cervical adenopathy. Psych:  Alert and cooperative. Normal affect.  LAB RESULTS: Recent Labs    11/19/19 1149 11/19/19 2110 11/20/19 0103  WBC 8.6  --   --   HGB 8.4* 7.5* 10.0*  HCT 27.2* 22.5* 31.2*  PLT 254  --   --    BMET Recent Labs    11/19/19 1537  NA 138  K 4.0  CL 108  CO2 19*  GLUCOSE 126*  BUN 28*  CREATININE 1.04*  CALCIUM 7.9*   LFT Recent Labs    11/19/19 1537  PROT 6.0*  ALBUMIN 3.0*  AST 135*  ALT 43  ALKPHOS 85  BILITOT 0.6   PT/INR Recent Labs    11/19/19 1537  LABPROT 16.0*  INR 1.3*    STUDIES: No results found.    Impression / Plan:   Brianna Benson is a 74 y.o. y/o female presented to the ER with 2 weeks of melena, long term use of NSAID's, was hypotensive earlier but not stable after 2 units prbc transfused and appropriate rise in HB. Likely NSAID related ulcer. Less likely variceal bleed.    Plan  1. Monitor CBC and transfuse 2. EGD today  3. IV PPI GTT 4. H pylori stool antigen  5. Stop all NSAID use.    I have discussed alternative options, risks & benefits,  which include, but are not limited to, bleeding, infection, perforation,respiratory complication & drug reaction.  The patient agrees with this plan & written consent will be obtained.      Thank you for involving me in the care of this patient.      LOS: 1 day   Jonathon Bellows, MD  11/20/2019, 7:44 AM

## 2019-11-20 NOTE — ED Notes (Signed)
Patient went to radiology for procedure by stretcher.

## 2019-11-20 NOTE — Op Note (Signed)
[facility] Gastroenterology Patient Name: Brianna Benson Procedure Date: 11/20/2019 1:01 PM MRN: [id number] Account #: [id number] Date of Birth: [birth year] Admit Type: Inpatient Age: 74 Room: [facility] ENDO ROOM 2 Gender: Female Note Status: Finalized Procedure:             Upper GI endoscopy Indications:           Melena Providers:             Wyline Mood MD, MD Referring MD:          Oswaldo Done. Hessie Diener MD, MD (Referring MD) Medicines:             Monitored Anesthesia Care Complications:         No immediate complications. Procedure:             Pre-Anesthesia Assessment:                        - Prior to the procedure, a History and Physical was                         performed, and patient medications, allergies and                         sensitivities were reviewed. The patient's tolerance                         of previous anesthesia was reviewed.                        - The risks and benefits of the procedure and the                         sedation options and risks were discussed with the                         patient. All questions were answered and informed                         consent was obtained.                        - ASA Grade Assessment: III - A patient with severe                         systemic disease.                        After obtaining informed consent, the endoscope was                         passed under direct vision. Throughout the procedure,                         the patient's blood pressure, pulse, and oxygen                         saturations were monitored continuously. The Endoscope                         was introduced through the mouth, and  advanced to the                         third part of duodenum. The upper GI endoscopy was                         accomplished with ease. The patient tolerated the                         procedure well. Findings:      The esophagus was normal.      Patchy severe inflammation  characterized by congestion (edema),       erosions, erythema and granularity was found in the gastric antrum.       Biopsies were taken with a cold forceps for histology.      Multiple small angioectasias without bleeding were found in the second       portion of the duodenum and in the third portion of the duodenum.       Coagulation for hemostasis using argon plasma at 0.5 liters/minute and       20 watts was successful.      One non-bleeding superficial duodenal ulcer with a clean ulcer base       (Forrest Class III) was found in the duodenal bulb. The lesion was 7 mm       in largest dimension.      Localized moderate inflammation characterized by congestion (edema),       erosions and erythema was found in the duodenal bulb. Biopsies were       taken with a cold forceps for histology.      The cardia and gastric fundus were normal on retroflexion. Impression:            - Normal esophagus.                        - Gastritis. Biopsied.                        - Multiple non-bleeding angioectasias in the duodenum.                         Treated with argon plasma coagulation (APC).                        - Non-bleeding duodenal ulcer with a clean ulcer base                         (Forrest Class III).                        - Duodenitis. Biopsied. Recommendation:        - Return patient to hospital ward for ongoing care.                        - Clear liquid diet today.                        - Continue present medications.                        - Stop NSAID use  6 weeks prilosec 40 mg daily                        F/u with GI as an outpatient Procedure Code(s):     --- Professional ---                        38937, 17, Esophagogastroduodenoscopy, flexible,                         transoral; with control of bleeding, any method                        43239, Esophagogastroduodenoscopy, flexible,                         transoral; with biopsy, single or  multiple Diagnosis Code(s):     --- Professional ---                        K29.70, Gastritis, unspecified, without bleeding                        K31.819, Angiodysplasia of stomach and duodenum                         without bleeding                        K26.9, Duodenal ulcer, unspecified as acute or                         chronic, without hemorrhage or perforation                        K29.80, Duodenitis without bleeding                        K92.1, Melena (includes Hematochezia) CPT copyright 2019 American Medical Association. All rights reserved. The codes documented in this report are preliminary and upon coder review may  be revised to meet current compliance requirements. Jonathon Bellows, MD Jonathon Bellows MD, MD 11/20/2019 1:27:32 PM This report has been signed electronically. Number of Addenda: 0 Note Initiated On: 11/20/2019 1:01 PM Estimated Blood Loss:  Estimated blood loss: none.      [facility]

## 2019-11-20 NOTE — Transfer of Care (Signed)
Immediate Anesthesia Transfer of Care Note  Patient: Brianna Benson  Procedure(s) Performed: ESOPHAGOGASTRODUODENOSCOPY (EGD) WITH PROPOFOL (N/A )  Patient Location: PACU  Anesthesia Type:General  Level of Consciousness: sedated  Airway & Oxygen Therapy: Patient Spontanous Breathing and Patient connected to nasal cannula oxygen  Post-op Assessment: Report given to RN and Post -op Vital signs reviewed and stable  Post vital signs: Reviewed and stable  Last Vitals:  Vitals Value Taken Time  BP    Temp    Pulse    Resp    SpO2      Last Pain:  Vitals:   11/20/19 1328  TempSrc: (P) Tympanic  PainSc:          Complications: No apparent anesthesia complications

## 2019-11-20 NOTE — Anesthesia Postprocedure Evaluation (Signed)
Anesthesia Post Note  Patient: Davonna Ertl  Procedure(s) Performed: ESOPHAGOGASTRODUODENOSCOPY (EGD) WITH PROPOFOL (N/A )  Patient location during evaluation: PACU Anesthesia Type: General Level of consciousness: awake and alert Pain management: pain level controlled Vital Signs Assessment: post-procedure vital signs reviewed and stable Respiratory status: spontaneous breathing, nonlabored ventilation, respiratory function stable and patient connected to nasal cannula oxygen Cardiovascular status: blood pressure returned to baseline and stable Postop Assessment: no apparent nausea or vomiting Anesthetic complications: no     Last Vitals:  Vitals:   11/20/19 1100 11/20/19 1328  BP:  (!) 106/49  Pulse:  76  Resp:  (!) 22  Temp: 36.9 C (!) 36.1 C  SpO2:  99%    Last Pain:  Vitals:   11/20/19 1328  TempSrc: Tympanic  PainSc: Asleep                 Yevette Edwards

## 2019-11-20 NOTE — ED Notes (Signed)
Attempted to call hospitalist on call at listed number, left message.

## 2019-11-20 NOTE — Progress Notes (Signed)
PROGRESS NOTE    Brianna Benson  OIZ:[id number] DOB: [birth year] DOA: 11/19/2019 PCP: Letta Median, MD   Brief Narrative:  Brianna Benson is a 74 y.o. female with medical history significant for hypertension and hypothyroidism who was sent to the emergency room from her primary care provider's office where she was noted to be hypotensive with systolic blood pressure in the 70s.  Patient presents with complaints of weakness, dizziness and lightheadedness mostly with standing.  Patient also states that she has been passing black stools for about 3 days.  She denies having abdominal pain, she denies having any hematemesis or hematochezia. Patient is on NSAIDS which she takes for headaches.   Patient also has an history of heavy alcohol use, stating that she is trying to decrease it. Hemoglobin on admission was 8.4 and she was given 1 unit of PRBC. Taken for EGD today.  Subjective: Patient was complaining of some headache when seen today.  She was waiting for her EGD.  Denies any hematemesis.  No bowel movement since in the hospital.  No abdominal pain.  Assessment & Plan:   Active Problems:   Acute blood loss anemia   Hypertension   Hypothyroidism   Nicotine dependence   GI bleed  Upper GI bleed.  Hemoccult positive.  Her symptoms are concerning for upper GI bleed.  No active bleeding.  She was taken for EGD today and found to have gastritis, nonbleeding multiple angiectasia's which were treated.  A small nonbleeding duodenal ulcer and duodenitis.  Biopsies were taken -Continue PPI. -Monitor for 1 more night-if hemoglobin remains stable can be discharged home to follow-up with GI as an outpatient.  Hypothyroidism. -Continue Synthroid.  Hypertension.  Pressure within goal. -Home antihypertensives were held on admission due to concern for softer blood pressure.  I will keep holding them today, can be restarted if blood pressure started going up.  Nicotine dependence.  -Nicotine  patch.  Objective: Vitals:   11/20/19 1328 11/20/19 1338 11/20/19 1348 11/20/19 1458  BP: (!) 106/49 (!) 120/54 (!) 120/52 131/64  Pulse: 76 68 66 66  Resp: (!) 22 19 17 16   Temp: (!) 97 F (36.1 C)   98.4 F (36.9 C)  TempSrc: Tympanic   Oral  SpO2: 99% 97% 98% 100%  Weight:    64.4 kg  Height:    5\' 5"  (1.651 m)    Intake/Output Summary (Last 24 hours) at 11/20/2019 1500 Last data filed at 11/20/2019 1329 Gross per 24 hour  Intake 1245.11 ml  Output 1 ml  Net 1244.11 ml   Filed Weights   11/19/19 1120 11/20/19 1458  Weight: 59 kg 64.4 kg    Examination:  General exam: Appears calm and comfortable  Respiratory system: Clear to auscultation. Respiratory effort normal. Cardiovascular system: S1 & S2 heard, RRR. No JVD, murmurs, rubs, gallops or clicks. Gastrointestinal system: Soft, nontender, nondistended, bowel sounds positive. Central nervous system: Alert and oriented. No focal neurological deficits.Symmetric 5 x 5 power. Extremities: No edema, no cyanosis, pulses intact and symmetrical. Psychiatry: Judgement and insight appear normal.   DVT prophylaxis: SCDs Code Status: Full Family Communication: Discussed with patient Disposition Plan:  Status is: Inpatient  Remains inpatient appropriate because:Inpatient level of care appropriate due to severity of illness   Dispo: The patient is from: Home              Anticipated d/c is to: Home              Anticipated d/c  date is: 1 day              Patient currently is not medically stable to d/c.  Patient with upper GI bleed.  We will keep her for another night after EGD.  If hemoglobin remained stable she can go home tomorrow  Consultants:   GI  Procedures:  EGD  Antimicrobials:   Data Reviewed: I have personally reviewed following labs and imaging studies  CBC: Recent Labs  Lab 11/19/19 1149 11/19/19 2110 11/20/19 0103 11/20/19 0754  WBC 8.6  --   --  8.5  NEUTROABS 6.7  --   --   --   HGB 8.4*  7.5* 10.0* 8.1*  HCT 27.2* 22.5* 31.2* 24.5*  MCV 91.0  --   --  87.2  PLT 254  --   --  153   Basic Metabolic Panel: Recent Labs  Lab 11/19/19 1537 11/20/19 0754  NA 138 144  K 4.0 3.6  CL 108 115*  CO2 19* 23  GLUCOSE 126* 100*  BUN 28* 28*  CREATININE 1.04* 0.79  CALCIUM 7.9* 7.6*   GFR: Estimated Creatinine Clearance: 57.2 mL/min (by C-G formula based on SCr of 0.79 mg/dL). Liver Function Tests: Recent Labs  Lab 11/19/19 1537  AST 135*  ALT 43  ALKPHOS 85  BILITOT 0.6  PROT 6.0*  ALBUMIN 3.0*   Recent Labs  Lab 11/19/19 1537  LIPASE 28   No results for input(s): AMMONIA in the last 168 hours. Coagulation Profile: Recent Labs  Lab 11/19/19 1537  INR 1.3*   Cardiac Enzymes: No results for input(s): CKTOTAL, CKMB, CKMBINDEX, TROPONINI in the last 168 hours. BNP (last 3 results) No results for input(s): PROBNP in the last 8760 hours. HbA1C: No results for input(s): HGBA1C in the last 72 hours. CBG: No results for input(s): GLUCAP in the last 168 hours. Lipid Profile: No results for input(s): CHOL, HDL, LDLCALC, TRIG, CHOLHDL, LDLDIRECT in the last 72 hours. Thyroid Function Tests: No results for input(s): TSH, T4TOTAL, FREET4, T3FREE, THYROIDAB in the last 72 hours. Anemia Panel: No results for input(s): VITAMINB12, FOLATE, FERRITIN, TIBC, IRON, RETICCTPCT in the last 72 hours. Sepsis Labs: No results for input(s): PROCALCITON, LATICACIDVEN in the last 168 hours.  Recent Results (from the past 240 hour(s))  SARS Coronavirus 2 by RT PCR (hospital order, performed in [facility] hospital lab) Nasopharyngeal Nasopharyngeal Swab     Status: None   Collection Time: 11/19/19 11:14 AM   Specimen: Nasopharyngeal Swab  Result Value Ref Range Status   SARS Coronavirus 2 NEGATIVE NEGATIVE Final    Comment: (NOTE) SARS-CoV-2 target nucleic acids are NOT DETECTED. The SARS-CoV-2 RNA is generally detectable in upper and lower respiratory specimens during the  acute phase of infection. The lowest concentration of SARS-CoV-2 viral copies this assay can detect is 250 copies / mL. A negative result does not preclude SARS-CoV-2 infection and should not be used as the sole basis for treatment or other patient management decisions.  A negative result may occur with improper specimen collection / handling, submission of specimen other than nasopharyngeal swab, presence of viral mutation(s) within the areas targeted by this assay, and inadequate number of viral copies (<250 copies / mL). A negative result must be combined with clinical observations, patient history, and epidemiological information. Fact Sheet for Patients:   BoilerBrush.com.cy Fact Sheet for Healthcare Providers: https://pope.com/ This test is not yet approved or cleared  by the [place] FDA and has been authorized for detection  and/or diagnosis of SARS-CoV-2 by FDA under an Emergency Use Authorization (EUA).  This EUA will remain in effect (meaning this test can be used) for the duration of the COVID-19 declaration under Section 564(b)(1) of the Act, 21 U.S.C. section 360bbb-3(b)(1), unless the authorization is terminated or revoked sooner. Performed at [facility], [address], [place], [place] [address]      Radiology Studies: No results found.  Scheduled Meds: . cholecalciferol  1,000 Units Oral Daily  . donepezil  5 mg Oral Daily  . escitalopram  10 mg Oral Daily  . levothyroxine  100 mcg Oral Daily  . nicotine  21 mg Transdermal Once  . pantoprazole  40 mg Oral BID   Continuous Infusions: . dextrose 5 % and 0.9% NaCl 100 mL/hr at 11/19/19 2336     LOS: 1 day   Time spent: 40 minutes.  Arnetha Courser, MD [facility]  If 7PM-7AM, please contact night-coverage [email or web address]  11/20/2019, 3:00 PM   This record has been created using Conservation officer, historic buildings. Errors have been sought  and corrected,but may not always be located. Such creation errors do not reflect on the standard of care.

## 2019-11-20 NOTE — ED Notes (Signed)
Pt placed back on bedpan. New bag of protonix hung.

## 2019-11-20 NOTE — ED Notes (Addendum)
Phone call with Webb Silversmith, NP regarding patient, verbal orders received.

## 2019-11-20 NOTE — Anesthesia Preprocedure Evaluation (Signed)
Anesthesia Evaluation  Patient identified by MRN, date of birth, ID band Patient awake    Reviewed: Allergy & Precautions, H&P , NPO status , Patient's Chart, lab work & pertinent test results, reviewed documented beta blocker date and time   Airway Mallampati: II   Neck ROM: full    Dental  (+) Poor Dentition   Pulmonary COPD, Current Smoker,    Pulmonary exam normal        Cardiovascular Exercise Tolerance: Poor hypertension, On Medications negative cardio ROS Normal cardiovascular exam Rhythm:regular Rate:Normal     Neuro/Psych negative neurological ROS  negative psych ROS   GI/Hepatic negative GI ROS, Neg liver ROS,   Endo/Other  Hypothyroidism   Renal/GU negative Renal ROS  negative genitourinary   Musculoskeletal   Abdominal   Peds  Hematology  (+) Blood dyscrasia, anemia ,   Anesthesia Other Findings Past Medical History: No date: Hypertension No date: Hypothyroidism No date: Wears dentures Past Surgical History: No date: ABDOMINAL HYSTERECTOMY 09/03/2016: CATARACT EXTRACTION W/PHACO; Left     Comment:  Procedure: CATARACT EXTRACTION PHACO AND INTRAOCULAR               LENS PLACEMENT (IOC)  Left;  Surgeon: Sherald Hess, MD;  Location: [facility];  Service:              Ophthalmology;  Laterality: Left; 08/04/2018: CATARACT EXTRACTION W/PHACO; Right     Comment:  Procedure: CATARACT EXTRACTION PHACO AND INTRAOCULAR               LENS PLACEMENT (IOC)  RIGHT;  Surgeon: Nevada Crane, MD;  Location: [facility];  Service:               Ophthalmology;  Laterality: Right; No date: COLONOSCOPY No date: LASIK No date: NASAL SINUS SURGERY BMI    Body Mass Index: 21.63 kg/m     Reproductive/Obstetrics negative OB ROS                             Anesthesia Physical Anesthesia Plan  ASA: III  Anesthesia Plan: General    Post-op Pain Management:    Induction:   PONV Risk Score and Plan:   Airway Management Planned:   Additional Equipment:   Intra-op Plan:   Post-operative Plan:   Informed Consent: I have reviewed the patients History and Physical, chart, labs and discussed the procedure including the risks, benefits and alternatives for the proposed anesthesia with the patient or authorized representative who has indicated his/her understanding and acceptance.     Dental Advisory Given  Plan Discussed with: CRNA  Anesthesia Plan Comments:         Anesthesia Quick Evaluation

## 2019-11-20 NOTE — ED Notes (Signed)
Pt placed on bedpan for BM.

## 2019-11-21 ENCOUNTER — Encounter
Admission: EM | Disposition: A | Discharge status: Home / Self Care | Payer: Self-pay | Source: Home / Self Care | Attending: Internal Medicine

## 2019-11-21 LAB — CBC
HCT: 24.9 % — ABNORMAL LOW (ref 36.0–46.0)
Hemoglobin: 8.3 g/dL — ABNORMAL LOW (ref 12.0–15.0)
MCH: 28.3 pg (ref 26.0–34.0)
MCHC: 33.3 g/dL (ref 30.0–36.0)
MCV: 85 fL (ref 80.0–100.0)
Platelets: 194 10*3/uL (ref 150–400)
RBC: 2.93 MIL/uL — ABNORMAL LOW (ref 3.87–5.11)
RDW: 19.9 % — ABNORMAL HIGH (ref 11.5–15.5)
WBC: 7.3 10*3/uL (ref 4.0–10.5)
nRBC: 0 % (ref 0.0–0.2)

## 2019-11-21 SURGERY — COLONOSCOPY WITH PROPOFOL
Anesthesia: General

## 2019-11-21 MED ORDER — ACETAMINOPHEN 325 MG PO TABS: 650.0000 mg | ORAL_TABLET | Freq: Four times a day (QID) | ORAL | 0 refills | Status: AC | PRN

## 2019-11-21 MED ORDER — PANTOPRAZOLE SODIUM 40 MG PO TBEC: 40.0000 mg | DELAYED_RELEASE_TABLET | Freq: Two times a day (BID) | ORAL | 1 refills | Status: AC

## 2019-11-21 MED ORDER — FERROUS SULFATE 325 (65 FE) MG PO TBEC
325.0000 mg | DELAYED_RELEASE_TABLET | Freq: Two times a day (BID) | ORAL | 3 refills | Status: AC
Start: 2019-11-21 — End: 2020-11-20

## 2019-11-21 NOTE — Discharge Summary (Signed)
Physician Discharge Summary  Brianna Benson TDD:[id number] DOB: [birth year] DOA: 11/19/2019  PCP: Oswaldo Conroy, MD  Admit date: 11/19/2019 Discharge date: 11/21/2019  Admitted From: Home Disposition:  Home  Recommendations for Outpatient Follow-up:  1. Follow up with PCP in 1-2 weeks 2. Follow-up with gastroenterology 3. Please obtain BMP/CBC in one week 4. Please follow up on the following pending results: Gastric biopsies  Home Health: No Equipment/Devices: None Discharge Condition: Stable CODE STATUS: Full Diet recommendation: Heart Healthy   Brief/Interim Summary: Brianna Benson a 74 y.o.femalewith medical history significant forhypertension and hypothyroidism who was sent to the emergency room from her primary care provider's office where she was noted to be hypotensive with systolic blood pressure in the 70s. Patient presentswith complaints of weakness, dizziness and lightheadedness mostly with standing.Patient also states that she has been passing blackstools for about 3 days. She denies having abdominal pain, she denies having any hematemesis or hematochezia. Patient is on NSAIDS which she takes for headaches.  Patient also has an history of heavy alcohol use, stating that she is trying to decrease it. Hemoglobin on admission was 8.4 and she was given 1 unit of PRBC, hemoglobin stable at 8.3 on discharge. She was taken for EGD today and found to have gastritis, nonbleeding multiple angiectasia's which were treated.  A small nonbleeding duodenal ulcer and duodenitis.  Biopsies were taken.  Tolerated the procedure well.  Denies any more melena.  No hematemesis.  She was started on iron supplement and will follow up with gastroenterology as an outpatient.  Was extensively counseled against the use of NSAID and alcohol.  She will continue with rest of her home meds and follow-up with primary care physician.  Discharge Diagnoses:  Active Problems:   Acute blood  loss anemia   Hypertension   Hypothyroidism   Nicotine dependence   Upper GI bleed   Symptomatic anemia   Orthostatic hypotension  Discharge Instructions  Discharge Instructions    Diet - low sodium heart healthy   Complete by: As directed    Discharge instructions   Complete by: As directed    It was pleasure taking care of you. Do not use Goody powder anymore.  You should also avoid ibuprofen and Aleve.  You can use Tylenol as needed for your pain. Use Protonix twice daily and follow-up with gastroenterology. If you experience any more black color stools you can seek medical attention.   Increase activity slowly   Complete by: As directed      Allergies as of 11/21/2019   No Known Allergies     Medication List    STOP taking these medications   BC HEADACHE POWDER PO     TAKE these medications   acetaminophen 325 MG tablet Commonly known as: TYLENOL Take 2 tablets (650 mg total) by mouth every 6 (six) hours as needed for mild pain or headache.   amLODipine 5 MG tablet Commonly known as: NORVASC Take 5 mg by mouth daily.   cholecalciferol 25 MCG (1000 UNIT) tablet Commonly known as: VITAMIN D Take 1,000 Units by mouth daily.   donepezil 5 MG tablet Commonly known as: ARICEPT Take 5 mg by mouth daily.   escitalopram 10 MG tablet Commonly known as: LEXAPRO Take 10 mg by mouth daily.   ferrous sulfate 325 (65 FE) MG EC tablet Take 1 tablet (325 mg total) by mouth 2 (two) times daily.   levothyroxine 100 MCG tablet Commonly known as: SYNTHROID Take 100 mcg by mouth daily.  lisinopril 20 MG tablet Commonly known as: ZESTRIL Take 20 mg by mouth daily.   pantoprazole 40 MG tablet Commonly known as: PROTONIX Take 1 tablet (40 mg total) by mouth 2 (two) times daily.      Follow-up Information    Jonathon Bellows, MD. Schedule an appointment as soon as possible for a visit.   Specialty: Gastroenterology Contact information: [place] [address] [phone]        Letta Median, MD. Schedule an appointment as soon as possible for a visit.   Specialty: Family Medicine Contact information: [place] [address] [phone]          No Known Allergies  Consultations:  GI  Procedures/Studies: No results found.  Subjective: Patient was feeling better when seen today.  No new complaints.  Discharge Exam: Vitals:   11/20/19 1933 11/21/19 0430  BP: (!) 122/40 (!) 146/78  Pulse: 64 87  Resp: 16 20  Temp: 98.4 F (36.9 C) 97.7 F (36.5 C)  SpO2: 100% 99%   Vitals:   11/20/19 1458 11/20/19 1606 11/20/19 1933 11/21/19 0430  BP: 131/64 (!) 134/50 (!) 122/40 (!) 146/78  Pulse: 66 66 64 87  Resp: 16 20 16 20   Temp: 98.4 F (36.9 C) 98.3 F (36.8 C) 98.4 F (36.9 C) 97.7 F (36.5 C)  TempSrc: Oral Oral Oral Oral  SpO2: 100% 100% 100% 99%  Weight: 64.4 kg     Height: 5\' 5"  (1.651 m)       General: Pt is alert, awake, not in acute distress Cardiovascular: RRR, S1/S2 +, no rubs, no gallops Respiratory: CTA bilaterally, no wheezing, no rhonchi Abdominal: Soft, NT, ND, bowel sounds + Extremities: no edema, no cyanosis   The results of significant diagnostics from this hospitalization (including imaging, microbiology, ancillary and laboratory) are listed below for reference.    Microbiology: Recent Results (from the past 240 hour(s))  SARS Coronavirus 2 by RT PCR (hospital order, performed in [facility] hospital lab) Nasopharyngeal Nasopharyngeal Swab     Status: None   Collection Time: 11/19/19 11:14 AM   Specimen: Nasopharyngeal Swab  Result Value Ref Range Status   SARS Coronavirus 2 NEGATIVE NEGATIVE Final    Comment: (NOTE) SARS-CoV-2 target nucleic acids are NOT DETECTED. The SARS-CoV-2 RNA is generally detectable in upper and lower respiratory specimens during the acute phase of infection. The lowest concentration of SARS-CoV-2 viral  copies this assay can detect is 250 copies / mL. A negative result does not preclude SARS-CoV-2 infection and should not be used as the sole basis for treatment or other patient management decisions.  A negative result may occur with improper specimen collection / handling, submission of specimen other than nasopharyngeal swab, presence of viral mutation(s) within the areas targeted by this assay, and inadequate number of viral copies (<250 copies / mL). A negative result must be combined with clinical observations, patient history, and epidemiological information. Fact Sheet for Patients:   StrictlyIdeas.no Fact Sheet for Healthcare Providers: BankingDealers.co.za This test is not yet approved or cleared  by the [place] FDA and has been authorized for detection and/or diagnosis of SARS-CoV-2 by FDA under an Emergency Use Authorization (EUA).  This EUA will remain in effect (meaning this test can be used) for the duration of the COVID-19 declaration under Section 564(b)(1) of the Act, 21 U.S.C. section 360bbb-3(b)(1), unless the authorization is terminated or revoked sooner. Performed at [facility], [phone]  [address] [place], [place] [address]      Labs: BNP (last 3 results) No results for input(s): BNP in the last 8760 hours. Basic Metabolic Panel: Recent Labs  Lab 11/19/19 1537 11/20/19 0754  NA 138 144  K 4.0 3.6  CL 108 115*  CO2 19* 23  GLUCOSE 126* 100*  BUN 28* 28*  CREATININE 1.04* 0.79  CALCIUM 7.9* 7.6*   Liver Function Tests: Recent Labs  Lab 11/19/19 1537  AST 135*  ALT 43  ALKPHOS 85  BILITOT 0.6  PROT 6.0*  ALBUMIN 3.0*   Recent Labs  Lab 11/19/19 1537  LIPASE 28   No results for input(s): AMMONIA in the last 168 hours. CBC: Recent Labs  Lab 11/19/19 1149 11/19/19 2110 11/20/19 0103 11/20/19 0754 11/21/19 0423  WBC 8.6  --   --  8.5 7.3  NEUTROABS 6.7  --   --   --   --    HGB 8.4* 7.5* 10.0* 8.1* 8.3*  HCT 27.2* 22.5* 31.2* 24.5* 24.9*  MCV 91.0  --   --  87.2 85.0  PLT 254  --   --  153 194   Cardiac Enzymes: No results for input(s): CKTOTAL, CKMB, CKMBINDEX, TROPONINI in the last 168 hours. BNP: Invalid input(s): POCBNP CBG: No results for input(s): GLUCAP in the last 168 hours. D-Dimer No results for input(s): DDIMER in the last 72 hours. Hgb A1c No results for input(s): HGBA1C in the last 72 hours. Lipid Profile No results for input(s): CHOL, HDL, LDLCALC, TRIG, CHOLHDL, LDLDIRECT in the last 72 hours. Thyroid function studies No results for input(s): TSH, T4TOTAL, T3FREE, THYROIDAB in the last 72 hours.  Invalid input(s): FREET3 Anemia work up No results for input(s): VITAMINB12, FOLATE, FERRITIN, TIBC, IRON, RETICCTPCT in the last 72 hours. Urinalysis No results found for: COLORURINE, APPEARANCEUR, LABSPEC, PHURINE, GLUCOSEU, HGBUR, BILIRUBINUR, KETONESUR, PROTEINUR, UROBILINOGEN, NITRITE, LEUKOCYTESUR Sepsis Labs Invalid input(s): PROCALCITONIN,  WBC,  LACTICIDVEN Microbiology Recent Results (from the past 240 hour(s))  SARS Coronavirus 2 by RT PCR (hospital order, performed in [facility] hospital lab) Nasopharyngeal Nasopharyngeal Swab     Status: None   Collection Time: 11/19/19 11:14 AM   Specimen: Nasopharyngeal Swab  Result Value Ref Range Status   SARS Coronavirus 2 NEGATIVE NEGATIVE Final    Comment: (NOTE) SARS-CoV-2 target nucleic acids are NOT DETECTED. The SARS-CoV-2 RNA is generally detectable in upper and lower respiratory specimens during the acute phase of infection. The lowest concentration of SARS-CoV-2 viral copies this assay can detect is 250 copies / mL. A negative result does not preclude SARS-CoV-2 infection and should not be used as the sole basis for treatment or other patient management decisions.  A negative result may occur with improper specimen collection / handling, submission of specimen other than  nasopharyngeal swab, presence of viral mutation(s) within the areas targeted by this assay, and inadequate number of viral copies (<250 copies / mL). A negative result must be combined with clinical observations, patient history, and epidemiological information. Fact Sheet for Patients:   BoilerBrush.com.cy Fact Sheet for Healthcare Providers: https://pope.com/ This test is not yet approved or cleared  by the [place] FDA and has been authorized for detection and/or diagnosis of SARS-CoV-2 by FDA under an Emergency Use Authorization (EUA).  This EUA will remain in effect (meaning this test can be used) for the duration of the COVID-19 declaration under Section 564(b)(1) of the Act, 21 U.S.C. section 360bbb-3(b)(1), unless the authorization is terminated or revoked  sooner. Performed at [facility], [address], [place], [place] [address]     Time coordinating discharge: Over 30 minutes  SIGNED:  Arnetha Courser, MD  [facility] 11/21/2019, 2:08 PM  If 7PM-7AM, please contact night-coverage [email or web address]  This record has been created using Conservation officer, historic buildings. Errors have been sought and corrected,but may not always be located. Such creation errors do not reflect on the standard of care.

## 2019-11-21 NOTE — Progress Notes (Signed)
Please call son, Jillyn Hidden" with updated information and/or discharge instructions. thanks

## 2019-11-21 NOTE — Plan of Care (Signed)
Discharge order received. Patient mental status is at baseline. Vital signs stable . No signs of acute distress. Discharge instructions given. Patient and SOn (greG)verbalized understanding. No other issues noted at this time.

## 2019-11-22 LAB — TYPE AND SCREEN
ABO/RH(D): A POS
Antibody Screen: NEGATIVE
Unit division: 0
Unit division: 0
Unit division: 0

## 2019-11-22 LAB — BPAM RBC
Blood Product Expiration Date: 202106072359
Blood Product Expiration Date: 202106072359
Blood Product Expiration Date: 202106072359
ISSUE DATE / TIME: 202105271653
ISSUE DATE / TIME: 202105272121
Unit Type and Rh: 6200
Unit Type and Rh: 6200
Unit Type and Rh: 6200

## 2019-11-22 LAB — PREPARE RBC (CROSSMATCH)

## 2019-11-25 LAB — SURGICAL PATHOLOGY

## 2019-12-01 DIAGNOSIS — Z23 Encounter for immunization: Secondary | ICD-10-CM | POA: Diagnosis not present

## 2019-12-01 DIAGNOSIS — I10 Essential (primary) hypertension: Secondary | ICD-10-CM | POA: Diagnosis not present

## 2019-12-01 DIAGNOSIS — R7401 Elevation of levels of liver transaminase levels: Secondary | ICD-10-CM | POA: Diagnosis not present

## 2019-12-01 DIAGNOSIS — D649 Anemia, unspecified: Secondary | ICD-10-CM | POA: Diagnosis not present

## 2019-12-01 DIAGNOSIS — E785 Hyperlipidemia, unspecified: Secondary | ICD-10-CM | POA: Diagnosis not present

## 2019-12-01 DIAGNOSIS — K922 Gastrointestinal hemorrhage, unspecified: Secondary | ICD-10-CM | POA: Diagnosis not present

## 2019-12-07 DIAGNOSIS — R0602 Shortness of breath: Secondary | ICD-10-CM | POA: Diagnosis not present

## 2019-12-07 DIAGNOSIS — Z9181 History of falling: Secondary | ICD-10-CM | POA: Diagnosis not present

## 2019-12-07 DIAGNOSIS — K922 Gastrointestinal hemorrhage, unspecified: Secondary | ICD-10-CM | POA: Diagnosis not present

## 2019-12-13 ENCOUNTER — Encounter: Payer: Self-pay | Admitting: Gastroenterology

## 2019-12-22 ENCOUNTER — Ambulatory Visit: Payer: Medicare HMO | Admitting: Gastroenterology

## 2019-12-22 DIAGNOSIS — M8580 Other specified disorders of bone density and structure, unspecified site: Secondary | ICD-10-CM | POA: Diagnosis not present

## 2019-12-22 DIAGNOSIS — D649 Anemia, unspecified: Secondary | ICD-10-CM | POA: Diagnosis not present

## 2019-12-22 DIAGNOSIS — K760 Fatty (change of) liver, not elsewhere classified: Secondary | ICD-10-CM | POA: Diagnosis not present

## 2019-12-22 DIAGNOSIS — E538 Deficiency of other specified B group vitamins: Secondary | ICD-10-CM | POA: Diagnosis not present

## 2019-12-22 DIAGNOSIS — F0280 Dementia in other diseases classified elsewhere without behavioral disturbance: Secondary | ICD-10-CM | POA: Diagnosis not present

## 2019-12-22 DIAGNOSIS — K922 Gastrointestinal hemorrhage, unspecified: Secondary | ICD-10-CM | POA: Diagnosis not present

## 2019-12-22 DIAGNOSIS — E039 Hypothyroidism, unspecified: Secondary | ICD-10-CM | POA: Diagnosis not present

## 2019-12-22 DIAGNOSIS — R1013 Epigastric pain: Secondary | ICD-10-CM | POA: Diagnosis not present

## 2019-12-22 DIAGNOSIS — I10 Essential (primary) hypertension: Secondary | ICD-10-CM | POA: Diagnosis not present

## 2019-12-29 DIAGNOSIS — K922 Gastrointestinal hemorrhage, unspecified: Secondary | ICD-10-CM | POA: Diagnosis not present

## 2019-12-29 DIAGNOSIS — E039 Hypothyroidism, unspecified: Secondary | ICD-10-CM | POA: Diagnosis not present

## 2019-12-29 DIAGNOSIS — R1013 Epigastric pain: Secondary | ICD-10-CM | POA: Diagnosis not present

## 2019-12-29 DIAGNOSIS — K760 Fatty (change of) liver, not elsewhere classified: Secondary | ICD-10-CM | POA: Diagnosis not present

## 2019-12-29 DIAGNOSIS — D649 Anemia, unspecified: Secondary | ICD-10-CM | POA: Diagnosis not present

## 2019-12-29 DIAGNOSIS — M8580 Other specified disorders of bone density and structure, unspecified site: Secondary | ICD-10-CM | POA: Diagnosis not present

## 2019-12-29 DIAGNOSIS — F0280 Dementia in other diseases classified elsewhere without behavioral disturbance: Secondary | ICD-10-CM | POA: Diagnosis not present

## 2019-12-29 DIAGNOSIS — E538 Deficiency of other specified B group vitamins: Secondary | ICD-10-CM | POA: Diagnosis not present

## 2019-12-29 DIAGNOSIS — I10 Essential (primary) hypertension: Secondary | ICD-10-CM | POA: Diagnosis not present

## 2019-12-30 DIAGNOSIS — F0280 Dementia in other diseases classified elsewhere without behavioral disturbance: Secondary | ICD-10-CM | POA: Diagnosis not present

## 2019-12-30 DIAGNOSIS — E538 Deficiency of other specified B group vitamins: Secondary | ICD-10-CM | POA: Diagnosis not present

## 2019-12-30 DIAGNOSIS — M8580 Other specified disorders of bone density and structure, unspecified site: Secondary | ICD-10-CM | POA: Diagnosis not present

## 2019-12-30 DIAGNOSIS — K760 Fatty (change of) liver, not elsewhere classified: Secondary | ICD-10-CM | POA: Diagnosis not present

## 2019-12-30 DIAGNOSIS — R1013 Epigastric pain: Secondary | ICD-10-CM | POA: Diagnosis not present

## 2019-12-30 DIAGNOSIS — E039 Hypothyroidism, unspecified: Secondary | ICD-10-CM | POA: Diagnosis not present

## 2019-12-30 DIAGNOSIS — K922 Gastrointestinal hemorrhage, unspecified: Secondary | ICD-10-CM | POA: Diagnosis not present

## 2019-12-30 DIAGNOSIS — D649 Anemia, unspecified: Secondary | ICD-10-CM | POA: Diagnosis not present

## 2019-12-30 DIAGNOSIS — I10 Essential (primary) hypertension: Secondary | ICD-10-CM | POA: Diagnosis not present

## 2020-01-05 DIAGNOSIS — K760 Fatty (change of) liver, not elsewhere classified: Secondary | ICD-10-CM | POA: Diagnosis not present

## 2020-01-05 DIAGNOSIS — E039 Hypothyroidism, unspecified: Secondary | ICD-10-CM | POA: Diagnosis not present

## 2020-01-05 DIAGNOSIS — E538 Deficiency of other specified B group vitamins: Secondary | ICD-10-CM | POA: Diagnosis not present

## 2020-01-05 DIAGNOSIS — D649 Anemia, unspecified: Secondary | ICD-10-CM | POA: Diagnosis not present

## 2020-01-05 DIAGNOSIS — R1013 Epigastric pain: Secondary | ICD-10-CM | POA: Diagnosis not present

## 2020-01-05 DIAGNOSIS — K922 Gastrointestinal hemorrhage, unspecified: Secondary | ICD-10-CM | POA: Diagnosis not present

## 2020-01-05 DIAGNOSIS — I10 Essential (primary) hypertension: Secondary | ICD-10-CM | POA: Diagnosis not present

## 2020-01-05 DIAGNOSIS — M8580 Other specified disorders of bone density and structure, unspecified site: Secondary | ICD-10-CM | POA: Diagnosis not present

## 2020-01-05 DIAGNOSIS — F0280 Dementia in other diseases classified elsewhere without behavioral disturbance: Secondary | ICD-10-CM | POA: Diagnosis not present

## 2020-01-06 DIAGNOSIS — K760 Fatty (change of) liver, not elsewhere classified: Secondary | ICD-10-CM | POA: Diagnosis not present

## 2020-01-06 DIAGNOSIS — I10 Essential (primary) hypertension: Secondary | ICD-10-CM | POA: Diagnosis not present

## 2020-01-06 DIAGNOSIS — D649 Anemia, unspecified: Secondary | ICD-10-CM | POA: Diagnosis not present

## 2020-01-06 DIAGNOSIS — E538 Deficiency of other specified B group vitamins: Secondary | ICD-10-CM | POA: Diagnosis not present

## 2020-01-06 DIAGNOSIS — K922 Gastrointestinal hemorrhage, unspecified: Secondary | ICD-10-CM | POA: Diagnosis not present

## 2020-01-06 DIAGNOSIS — F0280 Dementia in other diseases classified elsewhere without behavioral disturbance: Secondary | ICD-10-CM | POA: Diagnosis not present

## 2020-01-06 DIAGNOSIS — R1013 Epigastric pain: Secondary | ICD-10-CM | POA: Diagnosis not present

## 2020-01-06 DIAGNOSIS — E039 Hypothyroidism, unspecified: Secondary | ICD-10-CM | POA: Diagnosis not present

## 2020-01-06 DIAGNOSIS — M8580 Other specified disorders of bone density and structure, unspecified site: Secondary | ICD-10-CM | POA: Diagnosis not present

## 2020-01-08 ENCOUNTER — Other Ambulatory Visit: Payer: Self-pay | Admitting: General Surgery

## 2020-01-11 DIAGNOSIS — D649 Anemia, unspecified: Secondary | ICD-10-CM | POA: Diagnosis not present

## 2020-01-11 DIAGNOSIS — K922 Gastrointestinal hemorrhage, unspecified: Secondary | ICD-10-CM | POA: Diagnosis not present

## 2020-01-11 DIAGNOSIS — R1013 Epigastric pain: Secondary | ICD-10-CM | POA: Diagnosis not present

## 2020-01-11 DIAGNOSIS — I10 Essential (primary) hypertension: Secondary | ICD-10-CM | POA: Diagnosis not present

## 2020-01-11 DIAGNOSIS — E538 Deficiency of other specified B group vitamins: Secondary | ICD-10-CM | POA: Diagnosis not present

## 2020-01-11 DIAGNOSIS — F0280 Dementia in other diseases classified elsewhere without behavioral disturbance: Secondary | ICD-10-CM | POA: Diagnosis not present

## 2020-01-11 DIAGNOSIS — K760 Fatty (change of) liver, not elsewhere classified: Secondary | ICD-10-CM | POA: Diagnosis not present

## 2020-01-11 DIAGNOSIS — E039 Hypothyroidism, unspecified: Secondary | ICD-10-CM | POA: Diagnosis not present

## 2020-01-11 DIAGNOSIS — M8580 Other specified disorders of bone density and structure, unspecified site: Secondary | ICD-10-CM | POA: Diagnosis not present

## 2020-01-15 DIAGNOSIS — I10 Essential (primary) hypertension: Secondary | ICD-10-CM | POA: Diagnosis not present

## 2020-01-15 DIAGNOSIS — K922 Gastrointestinal hemorrhage, unspecified: Secondary | ICD-10-CM | POA: Diagnosis not present

## 2020-01-15 DIAGNOSIS — E039 Hypothyroidism, unspecified: Secondary | ICD-10-CM | POA: Diagnosis not present

## 2020-01-15 DIAGNOSIS — K760 Fatty (change of) liver, not elsewhere classified: Secondary | ICD-10-CM | POA: Diagnosis not present

## 2020-01-15 DIAGNOSIS — F0280 Dementia in other diseases classified elsewhere without behavioral disturbance: Secondary | ICD-10-CM | POA: Diagnosis not present

## 2020-01-15 DIAGNOSIS — E538 Deficiency of other specified B group vitamins: Secondary | ICD-10-CM | POA: Diagnosis not present

## 2020-01-15 DIAGNOSIS — M8580 Other specified disorders of bone density and structure, unspecified site: Secondary | ICD-10-CM | POA: Diagnosis not present

## 2020-01-15 DIAGNOSIS — D649 Anemia, unspecified: Secondary | ICD-10-CM | POA: Diagnosis not present

## 2020-01-15 DIAGNOSIS — R1013 Epigastric pain: Secondary | ICD-10-CM | POA: Diagnosis not present

## 2020-01-19 DIAGNOSIS — D649 Anemia, unspecified: Secondary | ICD-10-CM | POA: Diagnosis not present

## 2020-01-19 DIAGNOSIS — I10 Essential (primary) hypertension: Secondary | ICD-10-CM | POA: Diagnosis not present

## 2020-01-19 DIAGNOSIS — K922 Gastrointestinal hemorrhage, unspecified: Secondary | ICD-10-CM | POA: Diagnosis not present

## 2020-01-19 DIAGNOSIS — E538 Deficiency of other specified B group vitamins: Secondary | ICD-10-CM | POA: Diagnosis not present

## 2020-01-19 DIAGNOSIS — R1013 Epigastric pain: Secondary | ICD-10-CM | POA: Diagnosis not present

## 2020-01-19 DIAGNOSIS — F0280 Dementia in other diseases classified elsewhere without behavioral disturbance: Secondary | ICD-10-CM | POA: Diagnosis not present

## 2020-01-19 DIAGNOSIS — E039 Hypothyroidism, unspecified: Secondary | ICD-10-CM | POA: Diagnosis not present

## 2020-01-19 DIAGNOSIS — M8580 Other specified disorders of bone density and structure, unspecified site: Secondary | ICD-10-CM | POA: Diagnosis not present

## 2020-01-19 DIAGNOSIS — K760 Fatty (change of) liver, not elsewhere classified: Secondary | ICD-10-CM | POA: Diagnosis not present

## 2020-01-20 DIAGNOSIS — I10 Essential (primary) hypertension: Secondary | ICD-10-CM | POA: Diagnosis not present

## 2020-01-20 DIAGNOSIS — K760 Fatty (change of) liver, not elsewhere classified: Secondary | ICD-10-CM | POA: Diagnosis not present

## 2020-01-20 DIAGNOSIS — M8580 Other specified disorders of bone density and structure, unspecified site: Secondary | ICD-10-CM | POA: Diagnosis not present

## 2020-01-20 DIAGNOSIS — F0280 Dementia in other diseases classified elsewhere without behavioral disturbance: Secondary | ICD-10-CM | POA: Diagnosis not present

## 2020-01-20 DIAGNOSIS — E039 Hypothyroidism, unspecified: Secondary | ICD-10-CM | POA: Diagnosis not present

## 2020-01-20 DIAGNOSIS — D649 Anemia, unspecified: Secondary | ICD-10-CM | POA: Diagnosis not present

## 2020-01-20 DIAGNOSIS — R1013 Epigastric pain: Secondary | ICD-10-CM | POA: Diagnosis not present

## 2020-01-20 DIAGNOSIS — K922 Gastrointestinal hemorrhage, unspecified: Secondary | ICD-10-CM | POA: Diagnosis not present

## 2020-01-20 DIAGNOSIS — E538 Deficiency of other specified B group vitamins: Secondary | ICD-10-CM | POA: Diagnosis not present

## 2020-01-22 ENCOUNTER — Other Ambulatory Visit: Payer: Self-pay

## 2020-01-26 DIAGNOSIS — E538 Deficiency of other specified B group vitamins: Secondary | ICD-10-CM | POA: Diagnosis not present

## 2020-01-26 DIAGNOSIS — K760 Fatty (change of) liver, not elsewhere classified: Secondary | ICD-10-CM | POA: Diagnosis not present

## 2020-01-26 DIAGNOSIS — K922 Gastrointestinal hemorrhage, unspecified: Secondary | ICD-10-CM | POA: Diagnosis not present

## 2020-01-26 DIAGNOSIS — E039 Hypothyroidism, unspecified: Secondary | ICD-10-CM | POA: Diagnosis not present

## 2020-01-26 DIAGNOSIS — D649 Anemia, unspecified: Secondary | ICD-10-CM | POA: Diagnosis not present

## 2020-01-26 DIAGNOSIS — I10 Essential (primary) hypertension: Secondary | ICD-10-CM | POA: Diagnosis not present

## 2020-01-26 DIAGNOSIS — F0280 Dementia in other diseases classified elsewhere without behavioral disturbance: Secondary | ICD-10-CM | POA: Diagnosis not present

## 2020-01-26 DIAGNOSIS — R1013 Epigastric pain: Secondary | ICD-10-CM | POA: Diagnosis not present

## 2020-01-26 DIAGNOSIS — M8580 Other specified disorders of bone density and structure, unspecified site: Secondary | ICD-10-CM | POA: Diagnosis not present

## 2020-02-01 NOTE — Progress Notes (Signed)
Labs from PCP office dated December 01, 2019 reviewed.  HGB: 9.9. MCV: 88 Platelets: 428,000 Creat: 0.79, eGFR: 75 Folate: Low at 2.1 ng/ml.  LFT's notable for scant elevation of SBOT. TB: 0.4. Protein: 3.7

## 2020-02-02 DIAGNOSIS — K760 Fatty (change of) liver, not elsewhere classified: Secondary | ICD-10-CM | POA: Diagnosis not present

## 2020-02-02 DIAGNOSIS — E538 Deficiency of other specified B group vitamins: Secondary | ICD-10-CM | POA: Diagnosis not present

## 2020-02-02 DIAGNOSIS — K922 Gastrointestinal hemorrhage, unspecified: Secondary | ICD-10-CM | POA: Diagnosis not present

## 2020-02-02 DIAGNOSIS — E039 Hypothyroidism, unspecified: Secondary | ICD-10-CM | POA: Diagnosis not present

## 2020-02-02 DIAGNOSIS — M8580 Other specified disorders of bone density and structure, unspecified site: Secondary | ICD-10-CM | POA: Diagnosis not present

## 2020-02-02 DIAGNOSIS — F0280 Dementia in other diseases classified elsewhere without behavioral disturbance: Secondary | ICD-10-CM | POA: Diagnosis not present

## 2020-02-02 DIAGNOSIS — I10 Essential (primary) hypertension: Secondary | ICD-10-CM | POA: Diagnosis not present

## 2020-02-02 DIAGNOSIS — R1013 Epigastric pain: Secondary | ICD-10-CM | POA: Diagnosis not present

## 2020-02-02 DIAGNOSIS — D649 Anemia, unspecified: Secondary | ICD-10-CM | POA: Diagnosis not present

## 2020-02-03 ENCOUNTER — Other Ambulatory Visit: Payer: No Typology Code available for payment source

## 2020-02-05 ENCOUNTER — Ambulatory Visit: Admit: 2020-02-05 | Payer: Medicare HMO | Admitting: General Surgery

## 2020-02-05 SURGERY — ESOPHAGOGASTRODUODENOSCOPY (EGD) WITH PROPOFOL
Anesthesia: General

## 2020-08-17 ENCOUNTER — Other Ambulatory Visit: Payer: Self-pay | Admitting: *Deleted

## 2020-08-17 DIAGNOSIS — Z87891 Personal history of nicotine dependence: Secondary | ICD-10-CM

## 2020-08-17 DIAGNOSIS — Z122 Encounter for screening for malignant neoplasm of respiratory organs: Secondary | ICD-10-CM

## 2020-08-17 DIAGNOSIS — F17200 Nicotine dependence, unspecified, uncomplicated: Secondary | ICD-10-CM

## 2020-08-17 NOTE — Progress Notes (Signed)
Contacted and scheduled for annual lung screening scan. Patient is a current smoker with a 54 pack year history.  

## 2020-08-25 ENCOUNTER — Inpatient Hospital Stay: Admission: RE | Admit: 2020-08-25 | Payer: Medicare HMO | Source: Ambulatory Visit

## 2020-09-05 ENCOUNTER — Ambulatory Visit
Admission: RE | Admit: 2020-09-05 | Discharge: 2020-09-05 | Disposition: A | Discharge status: Home / Self Care | Payer: Medicare HMO | Source: Ambulatory Visit | Attending: Nurse Practitioner | Admitting: Nurse Practitioner

## 2020-09-05 ENCOUNTER — Other Ambulatory Visit: Payer: Self-pay

## 2020-09-05 DIAGNOSIS — F17200 Nicotine dependence, unspecified, uncomplicated: Secondary | ICD-10-CM | POA: Diagnosis present

## 2020-09-05 DIAGNOSIS — Z122 Encounter for screening for malignant neoplasm of respiratory organs: Secondary | ICD-10-CM | POA: Insufficient documentation

## 2020-09-05 DIAGNOSIS — Z87891 Personal history of nicotine dependence: Secondary | ICD-10-CM | POA: Diagnosis present

## 2020-09-12 ENCOUNTER — Encounter: Payer: Self-pay | Admitting: *Deleted

## 2020-09-14 ENCOUNTER — Other Ambulatory Visit: Payer: Self-pay | Admitting: Family Medicine

## 2020-09-14 DIAGNOSIS — Z1231 Encounter for screening mammogram for malignant neoplasm of breast: Secondary | ICD-10-CM

## 2020-09-27 ENCOUNTER — Ambulatory Visit
Admission: RE | Admit: 2020-09-27 | Discharge: 2020-09-27 | Disposition: A | Discharge status: Home / Self Care | Payer: Medicare HMO | Source: Ambulatory Visit | Attending: Family Medicine | Admitting: Family Medicine

## 2020-09-27 ENCOUNTER — Other Ambulatory Visit: Payer: Self-pay

## 2020-09-27 DIAGNOSIS — Z1231 Encounter for screening mammogram for malignant neoplasm of breast: Secondary | ICD-10-CM | POA: Insufficient documentation

## 2020-10-20 ENCOUNTER — Other Ambulatory Visit: Payer: Self-pay | Admitting: General Surgery

## 2020-10-20 NOTE — Progress Notes (Signed)
Subjective:     Patient ID: Brianna Benson is a 75 y.o. female.  HPI  The following portions of the patient's history were reviewed and updated as appropriate.  This an established patient is here today for: office visit. She is here today for to discuss having a colonoscopy. She states her bowels move daily, and denies any rectal bleeding. She is here with her son, Brianna Benson.  The patient was admitted May 2021 with a hemoglobin of 8.4.  Upper endoscopy showed multiple small nonbleeding areas of vascular ectasia in the second and third portion of the duodenum treated with argon plasma.  Severe inflammation of the gastric antrum was noted.  Biopsy was negative for H. pylori.  At that time she had been using a large amount of BC powder.  This in combination with 6+ beers per day and smoking were thought to be the etiology.  The patient reports that she is not drinking as much.  She eats a fairly regular diet.  Her son, Brianna Benson, reports that she does not eat quite as much meat as she did in her youth because of difficulty with her teeth but she does enjoy meat and greens.  The patient herself has not seen any black stools.    Review of Systems  Constitutional: Negative for chills and fever.  Respiratory: Negative for cough.        Chief Complaint  Patient presents with  . Follow-up     BP 112/62   Pulse 89   Temp 36.7 C (98 F)   Ht 165.1 cm (5\' 5" )   Wt 63 kg (139 lb)   SpO2 97%   BMI 23.13 kg/m       Past Medical History:  Diagnosis Date  . Hypertension   . Hypothyroidism   . Memory deficit    Dementia  . Wears dentures           Past Surgical History:  Procedure Laterality Date  . CATARACT EXTRACTION Left 09/03/2016   w/PHACO  . CATARACT EXTRACTION Right 08/04/2018   W/[facility]  . HYSTERECTOMY    . NASAL ENDOSCOPY    . PHOTOREFRACTIVE KERATOTOMY/LASIK    . UPPER GASTROINTESTINAL ENDOSCOPY  11/20/2019   Inpatient, Dr. 11/22/2019: + Gastritis/Duodenitis, Angiectasias, duodenitis              OB History    Gravida  3   Para  3   Term      Preterm      AB      Living        SAB      IAB      Ectopic      Molar      Multiple      Live Births          Obstetric Comments    Age at first pregnancy 18         Social History          Socioeconomic History  . Marital status: Widowed  Tobacco Use  . Smoking status: Current Every Day Smoker    Packs/day: 2.00    Years: 53.00    Pack years: 106.00    Types: Cigarettes  . Smokeless tobacco: Never Used  Substance and Sexual Activity  . Alcohol use: Yes    Comment: 5-6 beer daily  . Drug use: No  . Sexual activity: Defer           Allergies  Allergen Reactions  .  Lipitor [Atorvastatin] Unknown  . Wellbutrin [Bupropion Hcl] Unknown  . Zocor [Simvastatin] Unknown    Current Medications        Current Outpatient Medications  Medication Sig Dispense Refill  . albuterol sulfate (PROAIR DIGIHALER) 90 mcg/actuation aebs Inhale into the lungs as needed    . alendronate (FOSAMAX) 70 MG tablet Take by mouth every 7 (seven) days    . amLODIPine (NORVASC) 5 MG tablet Take 5 mg by mouth once daily       . cholecalciferol (VITAMIN D3) 1000 unit tablet Take by mouth once daily       . donepeziL (ARICEPT) 5 MG tablet Take by mouth once daily    . escitalopram oxalate (LEXAPRO) 10 MG tablet Take by mouth once daily    . ferrous sulfate 325 (65 FE) MG EC tablet Take by mouth 2 (two) times daily    . folic acid (FOLVITE) 1 MG tablet once daily    . levothyroxine (SYNTHROID) 125 MCG tablet Take by mouth once daily    . lisinopriL (ZESTRIL) 20 MG tablet Take 20 mg by mouth once daily    . pantoprazole (PROTONIX) 40 MG DR tablet Take by mouth 2 (two) times daily    . pantoprazole (PROTONIX) 40 MG DR tablet Take 1 tablet (40 mg total) by mouth once daily 90 tablet 3  . levothyroxine (SYNTHROID)  100 MCG tablet Take 100 mcg by mouth once daily Take on an empty stomach with a glass of water at least 30-60 minutes before breakfast. (Patient not taking: Reported on 10/20/2020)    . polyethylene glycol (MIRALAX) powder One bottle for colonoscopy prep. Use as directed. (Patient not taking: Reported on 10/20/2020) 255 g 0   No current facility-administered medications for this visit.           Family History  Problem Relation Age of Onset  . Breast cancer Maternal Aunt   . Lung cancer Brother   . Myocardial Infarction (Heart attack) Father          Objective:   Physical Exam Constitutional:      Appearance: Normal appearance.  Cardiovascular:     Rate and Rhythm: Normal rate and regular rhythm.     Pulses:          Carotid pulses are on the right side with bruit and on the left side with bruit.    Heart sounds: Murmur (II/VI over aortic outflow tract.  No radiation to carotids. ) heard.  Pulmonary:     Effort: Prolonged expiration present.     Breath sounds: Normal breath sounds.  Neurological:     General: No focal deficit present.     Mental Status: She is alert.  Psychiatric:        Mood and Affect: Mood normal.        Behavior: Behavior normal.       Labs and Radiology:   Laboratory dated August 22, 2020 showed a hemoglobin of 11.4 with an MCV of 98, white blood cell count of 6800 with a normal differential and a platelet count of 263,000.  Normal comprehensive metabolic panel with a creatinine of 0.58.  Elevated TSH at 31.  Nov 20, 2019 endoscopy report reviewed.   January 31, 2020 laboratory studies showed a hemoglobin of 9.9 with an MCV of 88.  Iron saturation was very low at 4%, iron low at 15 (27-139).  Ferritin low normal at 22 (15-150).     Assessment:   Candidate for upper  and lower endoscopy to reassess ulceration angiodysplasia of the stomach as well as potential colonic source for her rectal bleeding.  (Previously  reported.)  Hypothyroidism.    Plan:     The elevated TSH is from 2 months ago, and the patient had her Synthroid increased from 100 mcg to 125 mcg at that time.  We will need to recheck her TSH to confirm that she is trending towards euthyroid prior to proceeding.  Original plans were for the procedures to be Nov 11, 2020.  This may need to be postponed.  The procedure was reviewed.  Choice of fluids and coloration is less important than drinking all 2 quarts.  This to be scheduled convenient date as an outpatient.     This note is partially prepared by Dorathy Daft, RN, acting as a scribe in the presence of Dr. Donnalee Curry, MD.  The documentation recorded by the scribe accurately reflects the service I personally performed and the decisions made by me.   Earline Mayotte, MD FACS

## 2020-11-11 ENCOUNTER — Ambulatory Visit: Admit: 2020-11-11 | Payer: No Typology Code available for payment source | Admitting: General Surgery

## 2020-11-11 SURGERY — ESOPHAGOGASTRODUODENOSCOPY (EGD) WITH PROPOFOL
Anesthesia: General

## 2021-08-21 ENCOUNTER — Other Ambulatory Visit: Payer: Self-pay | Admitting: Family Medicine

## 2021-08-21 DIAGNOSIS — M8580 Other specified disorders of bone density and structure, unspecified site: Secondary | ICD-10-CM

## 2021-09-17 IMAGING — MG MM DIGITAL SCREENING BILAT W/ TOMO AND CAD
6 of 12 series · 6 of 36 positions shown · non-contrast
Comparison: Previous exam(s).

CLINICAL DATA: Screening.

EXAM:
DIGITAL SCREENING BILATERAL MAMMOGRAM WITH TOMOSYNTHESIS AND CAD
TECHNIQUE: Bilateral screening digital craniocaudal and mediolateral oblique
mammograms were obtained. Bilateral screening digital breast
tomosynthesis was performed. The images were evaluated with
computer-aided detection.

[R MLO synth-2D]
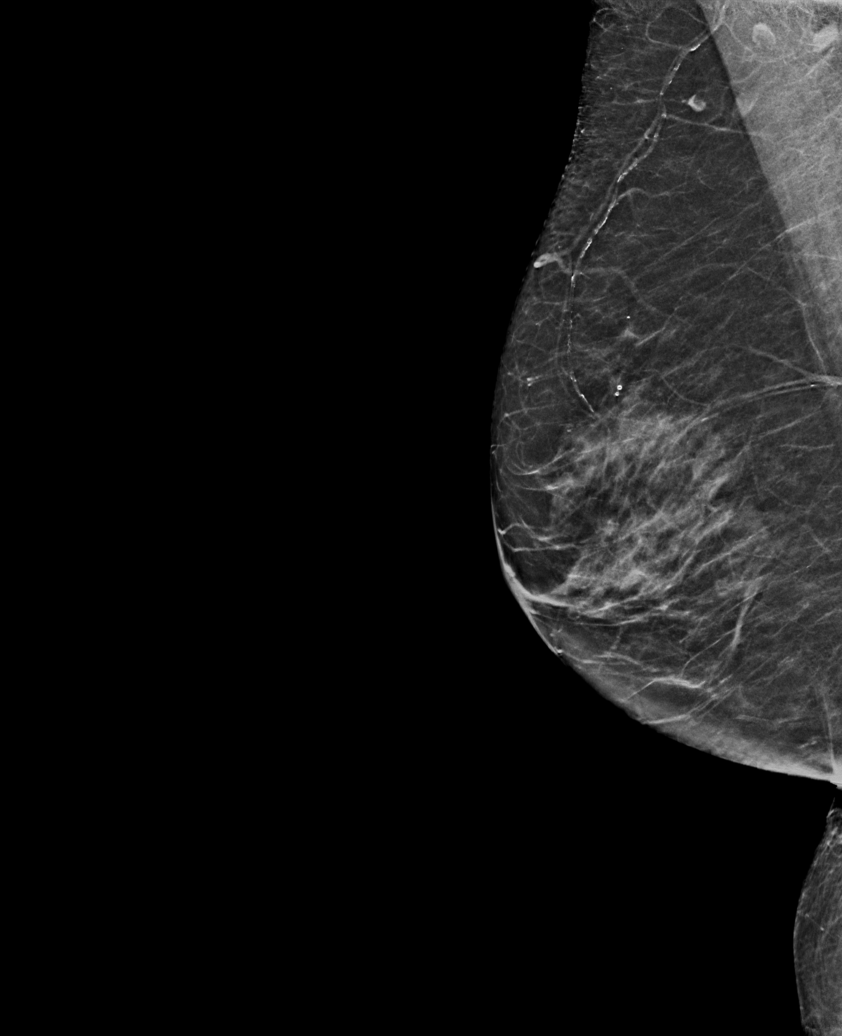

[R XCCL synth-2D]
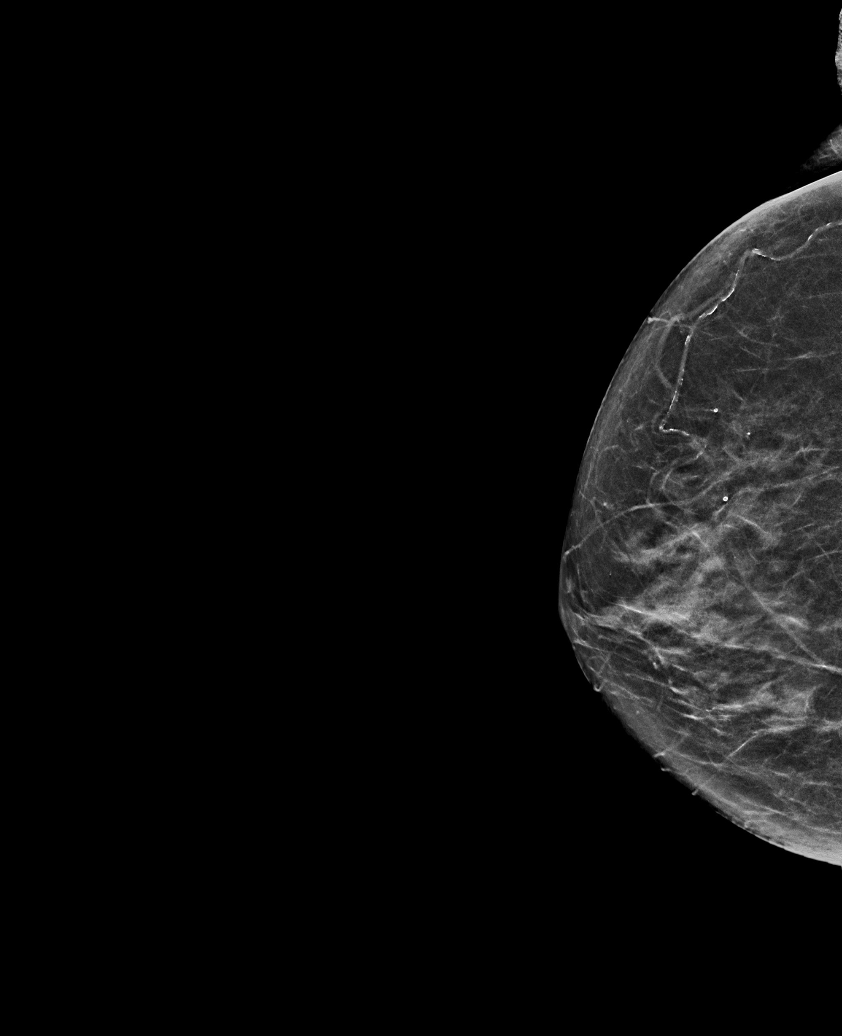

[L CC synth-2D (1 of 2)]
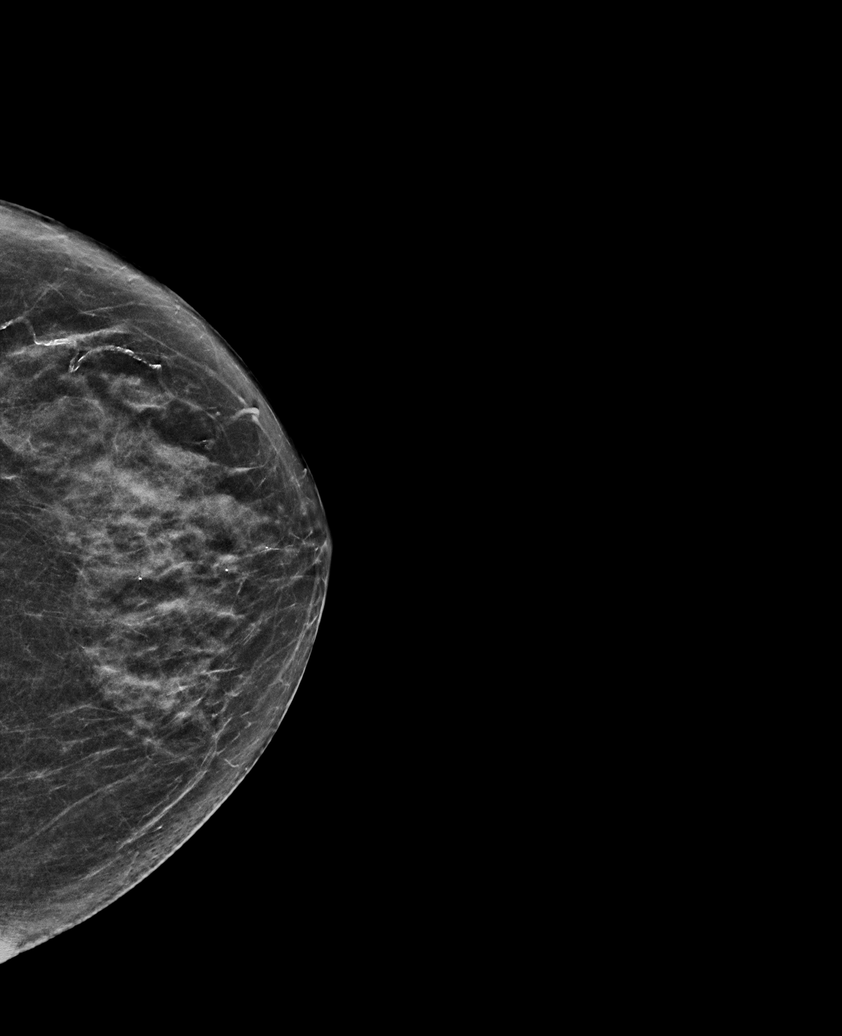

[L MLO synth-2D]
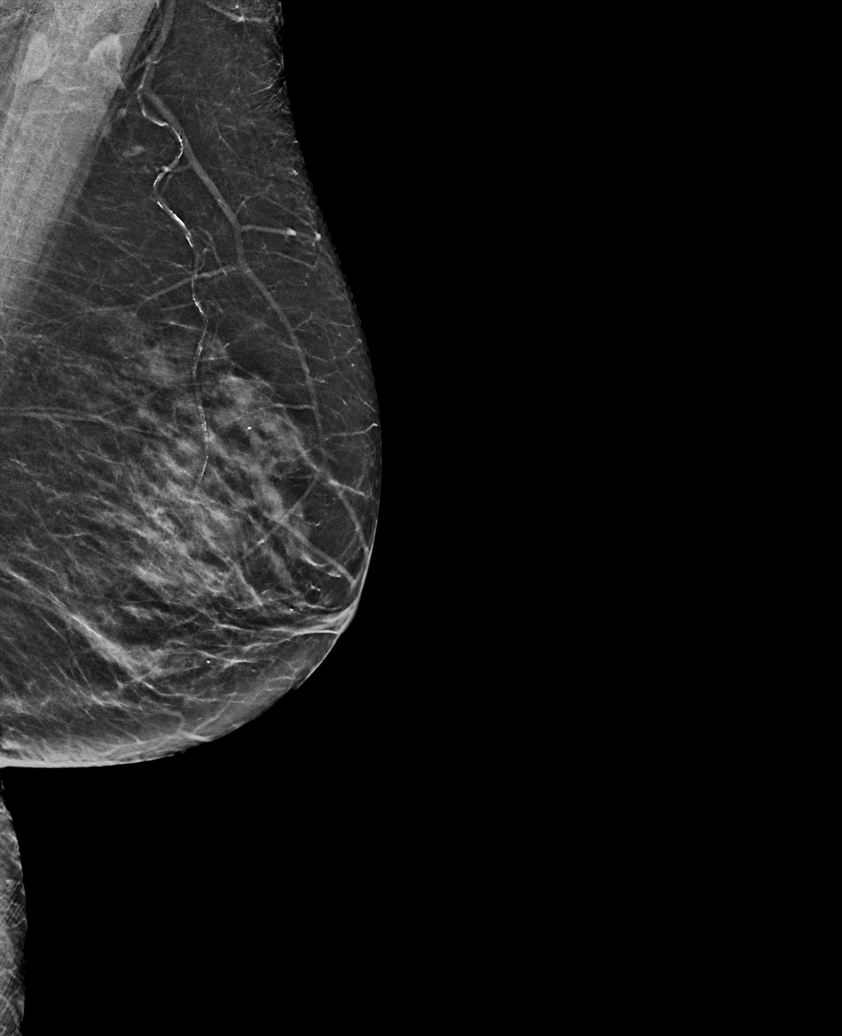

[R CC synth-2D]
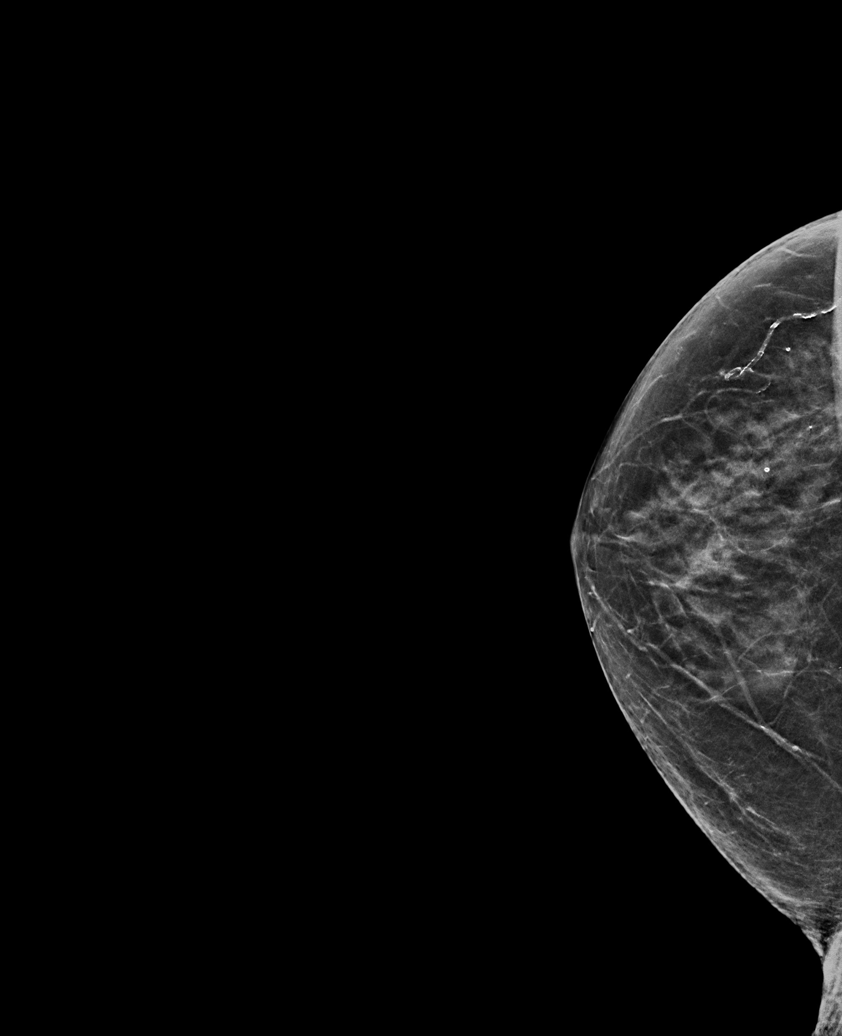

[L CC synth-2D (2 of 2)]
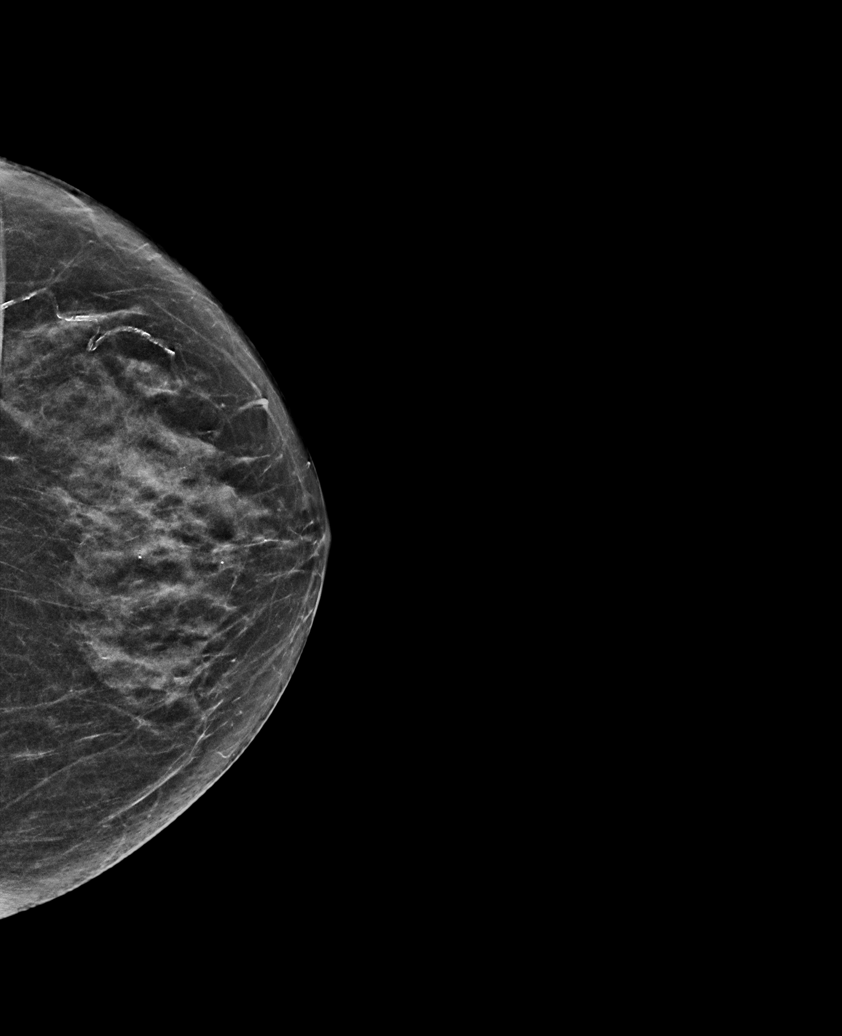

[6 of 36 positions shown; findings below may reference images not displayed]

ACR Breast Density Category c: The breast tissue is heterogeneously
dense, which may obscure small masses.
FINDINGS: There are no findings suspicious for malignancy. The images were
evaluated with computer-aided detection.
IMPRESSION: No mammographic evidence of malignancy. A result letter of this
screening mammogram will be mailed directly to the patient.

RECOMMENDATION:
Screening mammogram in one year. (Code:T4-5-GWO)

BI-RADS CATEGORY  1: Negative.

## 2021-10-03 ENCOUNTER — Other Ambulatory Visit: Payer: Self-pay | Admitting: *Deleted

## 2021-10-03 DIAGNOSIS — Z87891 Personal history of nicotine dependence: Secondary | ICD-10-CM

## 2021-10-03 DIAGNOSIS — F17210 Nicotine dependence, cigarettes, uncomplicated: Secondary | ICD-10-CM

## 2021-10-03 DIAGNOSIS — Z122 Encounter for screening for malignant neoplasm of respiratory organs: Secondary | ICD-10-CM

## 2021-10-11 ENCOUNTER — Ambulatory Visit
Admission: RE | Admit: 2021-10-11 | Discharge: 2021-10-11 | Disposition: A | Discharge status: Home / Self Care | Payer: Medicare HMO | Source: Ambulatory Visit | Attending: Acute Care | Admitting: Acute Care

## 2021-10-11 DIAGNOSIS — Z87891 Personal history of nicotine dependence: Secondary | ICD-10-CM | POA: Insufficient documentation

## 2021-10-11 DIAGNOSIS — F17210 Nicotine dependence, cigarettes, uncomplicated: Secondary | ICD-10-CM | POA: Insufficient documentation

## 2021-10-11 DIAGNOSIS — Z122 Encounter for screening for malignant neoplasm of respiratory organs: Secondary | ICD-10-CM | POA: Insufficient documentation

## 2021-10-13 ENCOUNTER — Other Ambulatory Visit: Payer: Self-pay

## 2021-10-13 DIAGNOSIS — F17210 Nicotine dependence, cigarettes, uncomplicated: Secondary | ICD-10-CM

## 2021-10-13 DIAGNOSIS — Z122 Encounter for screening for malignant neoplasm of respiratory organs: Secondary | ICD-10-CM

## 2021-10-13 DIAGNOSIS — Z87891 Personal history of nicotine dependence: Secondary | ICD-10-CM

## 2021-10-23 ENCOUNTER — Ambulatory Visit
Admission: RE | Admit: 2021-10-23 | Discharge: 2021-10-23 | Disposition: A | Discharge status: Home / Self Care | Payer: Medicare HMO | Source: Ambulatory Visit | Attending: Family Medicine | Admitting: Family Medicine

## 2021-10-23 DIAGNOSIS — Z13820 Encounter for screening for osteoporosis: Secondary | ICD-10-CM | POA: Diagnosis present

## 2021-10-23 DIAGNOSIS — Z780 Asymptomatic menopausal state: Secondary | ICD-10-CM | POA: Diagnosis not present

## 2021-10-23 DIAGNOSIS — M810 Age-related osteoporosis without current pathological fracture: Secondary | ICD-10-CM | POA: Diagnosis not present

## 2021-10-23 DIAGNOSIS — M8580 Other specified disorders of bone density and structure, unspecified site: Secondary | ICD-10-CM

## 2022-08-19 ENCOUNTER — Other Ambulatory Visit: Payer: Self-pay | Admitting: Acute Care

## 2022-08-19 DIAGNOSIS — Z122 Encounter for screening for malignant neoplasm of respiratory organs: Secondary | ICD-10-CM

## 2022-08-19 DIAGNOSIS — Z87891 Personal history of nicotine dependence: Secondary | ICD-10-CM

## 2022-08-19 DIAGNOSIS — F17210 Nicotine dependence, cigarettes, uncomplicated: Secondary | ICD-10-CM

## 2022-10-16 ENCOUNTER — Ambulatory Visit
Admission: RE | Admit: 2022-10-16 | Discharge: 2022-10-16 | Disposition: A | Discharge status: Home / Self Care | Payer: Medicare HMO | Source: Ambulatory Visit | Attending: Acute Care | Admitting: Acute Care

## 2022-10-16 DIAGNOSIS — K7460 Unspecified cirrhosis of liver: Secondary | ICD-10-CM | POA: Insufficient documentation

## 2022-10-16 DIAGNOSIS — F17210 Nicotine dependence, cigarettes, uncomplicated: Secondary | ICD-10-CM | POA: Insufficient documentation

## 2022-10-16 DIAGNOSIS — I700 Atherosclerosis of aorta: Secondary | ICD-10-CM | POA: Insufficient documentation

## 2022-10-16 DIAGNOSIS — J439 Emphysema, unspecified: Secondary | ICD-10-CM | POA: Diagnosis not present

## 2022-10-16 DIAGNOSIS — K766 Portal hypertension: Secondary | ICD-10-CM | POA: Diagnosis not present

## 2022-10-16 DIAGNOSIS — Z122 Encounter for screening for malignant neoplasm of respiratory organs: Secondary | ICD-10-CM | POA: Insufficient documentation

## 2022-10-16 DIAGNOSIS — Z87891 Personal history of nicotine dependence: Secondary | ICD-10-CM

## 2022-10-19 ENCOUNTER — Other Ambulatory Visit: Payer: Self-pay

## 2022-10-19 DIAGNOSIS — F17210 Nicotine dependence, cigarettes, uncomplicated: Secondary | ICD-10-CM

## 2022-10-19 DIAGNOSIS — Z87891 Personal history of nicotine dependence: Secondary | ICD-10-CM

## 2022-10-19 DIAGNOSIS — Z122 Encounter for screening for malignant neoplasm of respiratory organs: Secondary | ICD-10-CM

## 2023-10-17 ENCOUNTER — Ambulatory Visit
Admission: RE | Admit: 2023-10-17 | Discharge: 2023-10-17 | Disposition: A | Discharge status: Home / Self Care | Source: Ambulatory Visit | Attending: Acute Care | Admitting: Acute Care

## 2023-10-17 DIAGNOSIS — F17210 Nicotine dependence, cigarettes, uncomplicated: Secondary | ICD-10-CM | POA: Insufficient documentation

## 2023-10-17 DIAGNOSIS — I700 Atherosclerosis of aorta: Secondary | ICD-10-CM | POA: Insufficient documentation

## 2023-10-17 DIAGNOSIS — Z122 Encounter for screening for malignant neoplasm of respiratory organs: Secondary | ICD-10-CM | POA: Diagnosis present

## 2023-10-17 DIAGNOSIS — Z87891 Personal history of nicotine dependence: Secondary | ICD-10-CM

## 2023-10-17 DIAGNOSIS — J439 Emphysema, unspecified: Secondary | ICD-10-CM | POA: Insufficient documentation

## 2023-11-11 ENCOUNTER — Other Ambulatory Visit: Payer: Self-pay | Admitting: Acute Care

## 2023-11-11 DIAGNOSIS — F17210 Nicotine dependence, cigarettes, uncomplicated: Secondary | ICD-10-CM

## 2023-11-11 DIAGNOSIS — Z122 Encounter for screening for malignant neoplasm of respiratory organs: Secondary | ICD-10-CM

## 2023-11-11 DIAGNOSIS — Z87891 Personal history of nicotine dependence: Secondary | ICD-10-CM
